# Patient Record
Sex: Male | Born: 1955 | Race: White | Hispanic: No | Marital: Married | Smoking: Never smoker
Health system: Southern US, Community
[De-identification: ages and names within clinical notes are randomized; demographics above are authoritative.]

## PROBLEM LIST (undated history)

## (undated) DIAGNOSIS — K429 Umbilical hernia without obstruction or gangrene: Secondary | ICD-10-CM

## (undated) DIAGNOSIS — E349 Endocrine disorder, unspecified: Secondary | ICD-10-CM

## (undated) HISTORY — PX: POLYPECTOMY: SHX149

## (undated) HISTORY — PX: OTHER SURGICAL HISTORY: SHX169

## (undated) HISTORY — PX: COLONOSCOPY: SHX174

## (undated) HISTORY — DX: Endocrine disorder, unspecified: E34.9

## (undated) HISTORY — DX: Umbilical hernia without obstruction or gangrene: K42.9

---

## 2003-09-06 ENCOUNTER — Encounter: Payer: Self-pay | Admitting: Family Medicine

## 2003-12-10 ENCOUNTER — Emergency Department (HOSPITAL_COMMUNITY): Admission: EM | Admit: 2003-12-10 | Discharge: 2003-12-11 | Payer: Self-pay | Admitting: Emergency Medicine

## 2009-05-17 ENCOUNTER — Ambulatory Visit: Payer: Self-pay | Admitting: Family Medicine

## 2009-05-17 DIAGNOSIS — N529 Male erectile dysfunction, unspecified: Secondary | ICD-10-CM | POA: Insufficient documentation

## 2009-05-20 LAB — CONVERTED CEMR LAB
ALT: 47 units/L (ref 0–53)
AST: 31 units/L (ref 0–37)
Albumin: 4.2 g/dL (ref 3.5–5.2)
Basophils Absolute: 0 10*3/uL (ref 0.0–0.1)
CO2: 25 meq/L (ref 19–32)
Chloride: 105 meq/L (ref 96–112)
GFR calc non Af Amer: 83.75 mL/min (ref >60)
Glucose, Bld: 94 mg/dL (ref 70–99)
HCT: 46.4 % (ref 39.0–52.0)
HDL: 39.6 mg/dL (ref >39.00)
Hemoglobin: 15.9 g/dL (ref 13.0–17.0)
Lymphs Abs: 1.9 10*3/uL (ref 0.7–4.0)
MCV: 89.8 fL (ref 78.0–100.0)
Monocytes Absolute: 0.5 10*3/uL (ref 0.1–1.0)
Monocytes Relative: 9 % (ref 3.0–12.0)
Neutro Abs: 3.5 10*3/uL (ref 1.4–7.7)
Potassium: 4.4 meq/L (ref 3.5–5.1)
RDW: 12 % (ref 11.5–14.6)
Sodium: 138 meq/L (ref 135–145)
TSH: 1.12 microintl units/mL (ref 0.35–5.50)

## 2009-05-21 ENCOUNTER — Ambulatory Visit: Payer: Self-pay | Admitting: Internal Medicine

## 2009-05-27 ENCOUNTER — Ambulatory Visit: Payer: Self-pay | Admitting: Family Medicine

## 2009-05-27 DIAGNOSIS — R7989 Other specified abnormal findings of blood chemistry: Secondary | ICD-10-CM | POA: Insufficient documentation

## 2009-05-31 ENCOUNTER — Encounter: Payer: Self-pay | Admitting: Internal Medicine

## 2009-05-31 ENCOUNTER — Ambulatory Visit: Payer: Self-pay | Admitting: Internal Medicine

## 2009-06-03 ENCOUNTER — Encounter: Payer: Self-pay | Admitting: Internal Medicine

## 2009-06-26 ENCOUNTER — Ambulatory Visit: Payer: Self-pay | Admitting: Family Medicine

## 2009-07-16 ENCOUNTER — Telehealth: Payer: Self-pay | Admitting: Family Medicine

## 2009-12-19 ENCOUNTER — Ambulatory Visit: Payer: Self-pay | Admitting: Family Medicine

## 2010-02-20 ENCOUNTER — Ambulatory Visit: Payer: Self-pay | Admitting: Family Medicine

## 2010-06-20 ENCOUNTER — Telehealth: Payer: Self-pay | Admitting: Family Medicine

## 2010-07-24 ENCOUNTER — Telehealth: Payer: Self-pay | Admitting: Family Medicine

## 2010-09-02 NOTE — Progress Notes (Signed)
Summary: Pt called req refill of Androgel to [facility] on [address]  Phone Note Refill Request Message from:  Patient on June 20, 2010 3:20 PM  Refills Requested: Medication #1:  ANDROGEL SPRAY 50MG  8 pumps daily.   Dosage confirmed as above?Dosage Confirmed  Method Requested: Telephone to Pharmacy Initial call taken by: Lucy Antigua,  June 20, 2010 3:19 PM    Prescriptions: ANDROGEL SPRAY 50MG  8 pumps daily  #1 x 5   Entered by:   Sid Falcon LPN   Authorized by:   Evelena Peat MD   Signed by:   Sid Falcon LPN on 16/05/9603   Method used:   Telephoned to ...       [facility]  [address]  [phone]* (retail)       [address]       [place], [place]  [address]       Ph: [phone] or [phone]       Fax: [phone]   RxID:   [id number]

## 2010-09-02 NOTE — Assessment & Plan Note (Signed)
Summary: MED CK (REFILL) // RS   Vital Signs:  Patient profile:   55 year old male Weight:      255 pounds Temp:     98.6 degrees F oral BP sitting:   130 / 80  (left arm) Cuff size:   large  Vitals Entered By: Sid Falcon LPN (Dec 19, 2009 1:30 PM) CC: 6 month ROV, med refills   History of Present Illness: Low testosterone levels.  Androgel 6 sprays/day and improved symptoms but still with some low energy and libido at times. Would like to increase levels. No chest pain.  Normal PSA last Fall.  Tolerating medication well with no side effects.  Allergies (verified): No Known Drug Allergies  Past History:  Past Medical History: Last updated: 05/27/2009 Testosterone deficiency  Review of Systems  The patient denies anorexia, weight loss, weight gain, vision loss, chest pain, syncope, dyspnea on exertion, peripheral edema, and headaches.    Physical Exam  General:  Well-developed,well-nourished,in no acute distress; alert,appropriate and cooperative throughout examination Neck:  No deformities, masses, or tenderness noted. Lungs:  Normal respiratory effort, chest expands symmetrically. Lungs are clear to auscultation, no crackles or wheezes. Heart:  Normal rate and regular rhythm. S1 and S2 normal without gallop, murmur, click, rub or other extra sounds.   Impression & Recommendations:  Problem # 1:  TESTICULAR HYPOFUNCTION (ICD-257.2) increase to 8 sprays/day and recheck levels in 2 months.  Complete Medication List: 1)  Androgel Spray 50mg   .... 8 pumps daily  Patient Instructions: 1)  Schedule total testosterone level in 2 months 257.2 Prescriptions: ANDROGEL SPRAY 50MG  8 pumps daily  #1 x 5   Entered and Authorized by:   Evelena Peat MD   Signed by:   Evelena Peat MD on 12/19/2009   Method used:   Print then Give to Patient   RxID:   [id number]

## 2010-09-04 NOTE — Progress Notes (Signed)
Summary: Androgel quantity update  Phone Note From Pharmacy   Caller: [facility]  [address]  [phone]* Call For: burchette  Summary of Call: Faxed request from pharmacy, Androgel pump 1%, apply 8 pumpa topically daily.  Quantity 150.  "This only lasts pt 15 days.  Can we fill with 2 boxes/month Initial call taken by: Sid Falcon LPN,  July 24, 2010 4:57 PM  Follow-up for Phone Call        yes. Follow-up by: Evelena Peat MD,  July 24, 2010 5:25 PM  Additional Follow-up for Phone Call Additional follow up Details #1::        done Additional Follow-up by: Pura Spice, RN,  July 25, 2010 1:21 PM    New/Updated Medications: * ANDROGEL SPRAY 50MG  8 pumps daily Prescriptions: ANDROGEL SPRAY 50MG  8 pumps daily  #2 boxes x 0   Entered by:   Pura Spice, RN   Authorized by:   Evelena Peat MD   Signed by:   Pura Spice, RN on 07/25/2010   Method used:   Telephoned to ...       [facility]  [address]  [phone]* (retail)       [address]       [place], [place]  [address]       Ph: [phone] or [phone]       Fax: [phone]   RxID:   [id number]

## 2011-02-09 ENCOUNTER — Other Ambulatory Visit: Payer: Self-pay | Admitting: Family Medicine

## 2011-02-09 NOTE — Telephone Encounter (Signed)
Pt called req refill of Adrogel. Pt heard that there is a concentrated version of this med now and pt is wondering if he could get this instread or does pt have to sch ov? [facility] [address].

## 2011-02-09 NOTE — Telephone Encounter (Signed)
Last OV 5/11, inrformed pt he needs return OV

## 2011-02-10 ENCOUNTER — Encounter: Payer: Self-pay | Admitting: Family Medicine

## 2011-02-13 ENCOUNTER — Encounter: Payer: Self-pay | Admitting: Family Medicine

## 2011-02-13 ENCOUNTER — Ambulatory Visit (INDEPENDENT_AMBULATORY_CARE_PROVIDER_SITE_OTHER): Payer: 59 | Admitting: Family Medicine

## 2011-02-13 VITALS — BP 124/90 | Temp 98.7°F | Wt 243.0 lb

## 2011-02-13 DIAGNOSIS — E291 Testicular hypofunction: Secondary | ICD-10-CM

## 2011-02-13 DIAGNOSIS — Z125 Encounter for screening for malignant neoplasm of prostate: Secondary | ICD-10-CM

## 2011-02-13 LAB — PSA: PSA: 0.72 ng/mL (ref 0.10–4.00)

## 2011-02-13 MED ORDER — TESTOSTERONE 50 MG/5GM (1%) TD GEL: 5.0000 g | Freq: Every day | TRANSDERMAL | Status: DC

## 2011-02-13 NOTE — Progress Notes (Signed)
  Subjective:    Patient ID: Guy Mcgee, male    DOB: 03-29-1956, 55 y.o.   MRN: [id number]  HPI Patient seen for medical followup. Hypogonadism. On testosterone replacement. Labs last year revealed levels back up to normal range. Energy level is greatly improved. No history of problems with libido. No adverse side effects. No BPH symptoms. PSA last checked over a year ago and normal   Review of Systems  Constitutional: Negative for activity change, appetite change and unexpected weight change.  Respiratory: Negative for shortness of breath.   Cardiovascular: Negative for chest pain, palpitations and leg swelling.  Gastrointestinal: Negative for abdominal pain.       Objective:   Physical Exam  Constitutional: He appears well-developed and well-nourished.  HENT:  Head: Normocephalic and atraumatic.  Right Ear: External ear normal.  Left Ear: External ear normal.  Mouth/Throat: Oropharynx is clear and moist.  Neck: Neck supple. No thyromegaly present.  Cardiovascular: Normal rate, regular rhythm and normal heart sounds.  Exam reveals no gallop.   No murmur heard. Pulmonary/Chest: Effort normal and breath sounds normal. No respiratory distress. He has no wheezes. He has no rales.  Lymphadenopathy:    He has no cervical adenopathy.          Assessment & Plan:  Hypogonadism. Check PSA. Refill medication. No symptoms of BPH. Consider complete physical later this year

## 2011-02-13 NOTE — Patient Instructions (Signed)
Consider physical at some point this year.

## 2011-02-16 NOTE — Progress Notes (Signed)
Quick Note:  Pt informed ______ 

## 2011-05-21 ENCOUNTER — Other Ambulatory Visit: Payer: Self-pay | Admitting: Family Medicine

## 2011-05-21 DIAGNOSIS — E291 Testicular hypofunction: Secondary | ICD-10-CM

## 2011-05-21 MED ORDER — TESTOSTERONE 50 MG/5GM (1%) TD GEL: 5.0000 g | Freq: Every day | TRANSDERMAL | Status: DC

## 2011-05-21 NOTE — Telephone Encounter (Signed)
Pt said that his testosterone (ANDROGEL) 50 MG/5GM GEL is showing 0 refills remaining. Pt is req a script to be called in [facility] on [address]

## 2011-07-10 ENCOUNTER — Ambulatory Visit: Payer: 59 | Admitting: Family Medicine

## 2011-11-13 ENCOUNTER — Telehealth: Payer: Self-pay | Admitting: Family Medicine

## 2011-11-13 DIAGNOSIS — E291 Testicular hypofunction: Secondary | ICD-10-CM

## 2011-11-13 MED ORDER — TESTOSTERONE 50 MG/5GM (1%) TD GEL: TRANSDERMAL | Status: DC

## 2011-11-13 NOTE — Telephone Encounter (Signed)
Last OV 02-13-11, last filled # 1 with 6 refills

## 2011-11-13 NOTE — Telephone Encounter (Signed)
Rx called in, pt informed he needs return OV in the next month or so.  He will schedule

## 2011-11-13 NOTE — Telephone Encounter (Signed)
Refill OK but go ahead and schedule office follow up within 2 months.

## 2011-11-13 NOTE — Telephone Encounter (Signed)
Pt's Androgel is about to run out. Can he get a refill, or does he need an OV first? Please call. Pt uses [facility] on [address].

## 2011-12-08 ENCOUNTER — Encounter: Payer: Self-pay | Admitting: Family Medicine

## 2011-12-08 ENCOUNTER — Ambulatory Visit (INDEPENDENT_AMBULATORY_CARE_PROVIDER_SITE_OTHER): Payer: 59 | Admitting: Family Medicine

## 2011-12-08 ENCOUNTER — Telehealth: Payer: Self-pay | Admitting: *Deleted

## 2011-12-08 VITALS — BP 122/88 | Temp 98.3°F | Wt 234.0 lb

## 2011-12-08 DIAGNOSIS — Z0000 Encounter for general adult medical examination without abnormal findings: Secondary | ICD-10-CM

## 2011-12-08 DIAGNOSIS — D582 Other hemoglobinopathies: Secondary | ICD-10-CM

## 2011-12-08 DIAGNOSIS — E291 Testicular hypofunction: Secondary | ICD-10-CM

## 2011-12-08 LAB — LIPID PANEL
Cholesterol: 206 mg/dL — ABNORMAL HIGH (ref 0–200)
Total CHOL/HDL Ratio: 4
Triglycerides: 77 mg/dL (ref 0.0–149.0)
VLDL: 15.4 mg/dL (ref 0.0–40.0)

## 2011-12-08 LAB — HEPATIC FUNCTION PANEL
Albumin: 4.3 g/dL (ref 3.5–5.2)
Alkaline Phosphatase: 49 U/L (ref 39–117)
Bilirubin, Direct: 0.1 mg/dL (ref 0.0–0.3)

## 2011-12-08 LAB — CBC WITH DIFFERENTIAL/PLATELET
Basophils Absolute: 0 10*3/uL (ref 0.0–0.1)
Lymphocytes Relative: 22.8 % (ref 12.0–46.0)
Lymphs Abs: 1.7 10*3/uL (ref 0.7–4.0)
Monocytes Relative: 7.4 % (ref 3.0–12.0)
Platelets: 227 10*3/uL (ref 150.0–400.0)
RDW: 14 % (ref 11.5–14.6)

## 2011-12-08 LAB — LDL CHOLESTEROL, DIRECT: Direct LDL: 147.5 mg/dL

## 2011-12-08 LAB — BASIC METABOLIC PANEL
Chloride: 104 mEq/L (ref 96–112)
Potassium: 5.1 mEq/L (ref 3.5–5.1)

## 2011-12-08 LAB — TSH: TSH: 0.96 u[IU]/mL (ref 0.35–5.50)

## 2011-12-08 NOTE — Telephone Encounter (Signed)
Please send this to Dr. Caryl Never

## 2011-12-08 NOTE — Progress Notes (Signed)
  Subjective:    Patient ID: Guy Mcgee, male    DOB: March 14, 1956, 56 y.o.   MRN: [id number]  HPI  Patient seen for complete physical. History of hypogonadism treated with testosterone replacement. He feels much better since starting this couple years ago. He is exercising regularly. Has lost over 6 inches off his waist and weight overall has improved. Increased stamina. Sleeping well. No adverse side effects. No chest pains. Needs repeat lab work.  Tetanus up-to-date. Colonoscopy up to date. No history of smoking.  Past Medical History  Diagnosis Date  . Testosterone deficiency    Past Surgical History  Procedure Date  . Inguinal herniorrhapy     reports that he has never smoked. He does not have any smokeless tobacco history on file. His alcohol and drug histories not on file. family history includes Cancer in his other; Diabetes in his other; and Hypertension in his other. No Known Allergies    Review of Systems  Constitutional: Negative for fever, activity change, appetite change and fatigue.  HENT: Negative for ear pain, congestion and trouble swallowing.   Eyes: Negative for pain and visual disturbance.  Respiratory: Negative for cough, shortness of breath and wheezing.   Cardiovascular: Negative for chest pain and palpitations.  Gastrointestinal: Negative for nausea, vomiting, abdominal pain, diarrhea, constipation, blood in stool, abdominal distention and rectal pain.  Genitourinary: Negative for dysuria, hematuria and testicular pain.  Musculoskeletal: Negative for joint swelling and arthralgias.  Skin: Negative for rash.  Neurological: Negative for dizziness, syncope and headaches.  Hematological: Negative for adenopathy.  Psychiatric/Behavioral: Negative for confusion and dysphoric mood.       Objective:   Physical Exam  Constitutional: He is oriented to person, place, and time. He appears well-developed and well-nourished. No distress.  HENT:  Head:  Normocephalic and atraumatic.  Right Ear: External ear normal.  Left Ear: External ear normal.  Mouth/Throat: Oropharynx is clear and moist.  Eyes: Conjunctivae and EOM are normal. Pupils are equal, round, and reactive to light.  Neck: Normal range of motion. Neck supple. No thyromegaly present.  Cardiovascular: Normal rate, regular rhythm and normal heart sounds.   No murmur heard. Pulmonary/Chest: No respiratory distress. He has no wheezes. He has no rales.  Abdominal: Soft. Bowel sounds are normal. He exhibits no distension and no mass. There is no tenderness. There is no rebound and no guarding.  Musculoskeletal: He exhibits no edema.  Lymphadenopathy:    He has no cervical adenopathy.  Neurological: He is alert and oriented to person, place, and time. He displays normal reflexes. No cranial nerve deficit.  Skin: No rash noted.  Psychiatric: He has a normal mood and affect.          Assessment & Plan:  Health maintenance exam. Obtain screening lab work. Include PSA and also check repeat testosterone levels. Tetanus and colonoscopy up to date. Continue regular exercise.

## 2011-12-08 NOTE — Telephone Encounter (Signed)
Critical lab phone call from [facility], pt hgb 18.2.  Dr Caryl Never pt, he is not here currently, will inform Dr Tawanna Cooler

## 2011-12-08 NOTE — Telephone Encounter (Signed)
Reduce testosterone to 3 spray pumps per arm daily (currently at 4) and repeat CBC 2 months

## 2011-12-09 NOTE — Telephone Encounter (Signed)
Addended by: Melchor Amour on: 12/09/2011 09:21 AM   Modules accepted: Orders

## 2011-12-09 NOTE — Telephone Encounter (Signed)
Pt informed, CBC, future lab ordered

## 2011-12-10 NOTE — Progress Notes (Signed)
Quick Note:  Pt informed, future labs ordered ______ 

## 2012-02-11 ENCOUNTER — Other Ambulatory Visit: Payer: 59

## 2012-02-15 ENCOUNTER — Other Ambulatory Visit (INDEPENDENT_AMBULATORY_CARE_PROVIDER_SITE_OTHER): Payer: 59

## 2012-02-15 DIAGNOSIS — D582 Other hemoglobinopathies: Secondary | ICD-10-CM

## 2012-02-15 LAB — CBC
HCT: 51.7 % (ref 39.0–52.0)
MCHC: 33.5 g/dL (ref 30.0–36.0)
MCV: 91.1 fl (ref 78.0–100.0)
Platelets: 200 10*3/uL (ref 150.0–400.0)

## 2012-02-25 ENCOUNTER — Other Ambulatory Visit: Payer: Self-pay | Admitting: Family Medicine

## 2012-02-25 NOTE — Telephone Encounter (Signed)
Pt had CBC repeat lab after 1 month, OK.  Androgel last filled 11-13-11, 2 packages with 1 refill.

## 2012-02-28 NOTE — Telephone Encounter (Signed)
OK to refill for 6 months.  Don't know if his insurance would allow, but we should give him option of more concentrated Androgel 1.62% which would be only total of 4 pump sprays daily (2 per side).

## 2012-02-29 NOTE — Telephone Encounter (Signed)
Last seen 12/2011 - and had labs done at that time too - please advise

## 2012-03-01 NOTE — Telephone Encounter (Signed)
Refill for 6 months. 

## 2012-03-01 NOTE — Telephone Encounter (Signed)
Last OV may, 2013, no future appts scheduled Androgel last filled 11-13-11, two packages with 1 refill

## 2012-03-02 ENCOUNTER — Other Ambulatory Visit: Payer: Self-pay | Admitting: *Deleted

## 2012-03-02 DIAGNOSIS — E291 Testicular hypofunction: Secondary | ICD-10-CM

## 2012-03-02 MED ORDER — TESTOSTERONE 50 MG/5GM (1%) TD GEL: TRANSDERMAL | Status: DC

## 2012-09-08 ENCOUNTER — Other Ambulatory Visit: Payer: Self-pay | Admitting: Family Medicine

## 2012-10-10 ENCOUNTER — Ambulatory Visit (INDEPENDENT_AMBULATORY_CARE_PROVIDER_SITE_OTHER)
Admission: RE | Admit: 2012-10-10 | Discharge: 2012-10-10 | Disposition: A | Discharge status: Home / Self Care | Payer: 59 | Source: Ambulatory Visit | Attending: Family Medicine | Admitting: Family Medicine

## 2012-10-10 ENCOUNTER — Ambulatory Visit (INDEPENDENT_AMBULATORY_CARE_PROVIDER_SITE_OTHER): Payer: 59 | Admitting: Family Medicine

## 2012-10-10 ENCOUNTER — Encounter: Payer: Self-pay | Admitting: Family Medicine

## 2012-10-10 VITALS — BP 128/88 | Temp 99.2°F | Wt 256.0 lb

## 2012-10-10 DIAGNOSIS — R1011 Right upper quadrant pain: Secondary | ICD-10-CM

## 2012-10-10 LAB — CBC WITH DIFFERENTIAL/PLATELET
Basophils Absolute: 0 10*3/uL (ref 0.0–0.1)
Eosinophils Absolute: 0.3 10*3/uL (ref 0.0–0.7)
Eosinophils Relative: 2.8 % (ref 0.0–5.0)
HCT: 47.3 % (ref 39.0–52.0)
Lymphs Abs: 1.6 10*3/uL (ref 0.7–4.0)
MCHC: 33.7 g/dL (ref 30.0–36.0)
MCV: 89.2 fl (ref 78.0–100.0)
Monocytes Absolute: 1 10*3/uL (ref 0.1–1.0)
Platelets: 249 10*3/uL (ref 150.0–400.0)
RDW: 13 % (ref 11.5–14.6)

## 2012-10-10 NOTE — Patient Instructions (Addendum)
No further NSAIDS (eg Ibuprofen) Take daily Prilosec or Prevacid for now until this is resolving.

## 2012-10-10 NOTE — Progress Notes (Signed)
  Subjective:    Patient ID: Guy Mcgee, male    DOB: September 12, 1955, 57 y.o.   MRN: [id number]  HPI Patient seen for acute visit.  Right upper quadrant pain. Onset last Friday. He describes soreness to touch and worse with movement. No known injury. Quality is sharp to achy. 7-8/10 intensity at times Constant. No clear triggers other than movement. Not related to eating. Took ibuprofen without relief. Denies associated nausea or vomiting. No cough. Mild decreased appetite. No rash.  Patient relates possible low-grade fever past couple days though no temperature over 100. Temperature in 99 range. Last night did describe chills and sweats. -again, no cough   Review of Systems  Constitutional: Positive for fever and chills.  Respiratory: Negative for cough, chest tightness, shortness of breath and wheezing.   Cardiovascular: Negative for chest pain, palpitations and leg swelling.  Gastrointestinal: Positive for abdominal pain. Negative for nausea, vomiting, diarrhea, blood in stool and abdominal distention.  Skin: Negative for rash.       Objective:   Physical Exam  Constitutional: He appears well-developed and well-nourished. No distress.  Neck: Neck supple.  Cardiovascular: Regular rhythm.   Pulmonary/Chest: Effort normal and breath sounds normal. No respiratory distress. He has no wheezes. He has no rales.  Abdominal: Soft. Bowel sounds are normal. He exhibits no distension and no mass. There is no rebound and no guarding.  Mild tenderness right upper quadrant. He has some tenderness over the right lower rib cage area. No hepatomegaly. No splenomegaly  Genitourinary: Rectum normal.  No rectal mass and brown heme negative stool.  Skin: No rash noted.          Assessment & Plan:  Abdominal pain right upper quadrant. Constant nature,pain to touch and augmentation with movement suggests possible musculoskeletal origin. He has not had any cough or other findings to suggest  likely pneumonia. Doubt symptomatic gallstones. Doubt peptic ulcer disease. No rash (yet) to suggest shingles.  Start with labs -CBC, hepatic panel, basic metabolic panel. Chest x-ray. Consider abdominal ultrasound

## 2012-10-11 LAB — BASIC METABOLIC PANEL
BUN: 13 mg/dL (ref 6–23)
Chloride: 101 mEq/L (ref 96–112)
GFR: 77.28 mL/min (ref >60.00)
Potassium: 4.6 mEq/L (ref 3.5–5.1)
Sodium: 136 mEq/L (ref 135–145)

## 2012-10-11 LAB — HEPATIC FUNCTION PANEL
Bilirubin, Direct: 0.2 mg/dL (ref 0.0–0.3)
Total Protein: 7.2 g/dL (ref 6.0–8.3)

## 2012-10-11 NOTE — Progress Notes (Signed)
Quick Note:  LMTCB ______ 

## 2012-10-14 NOTE — Progress Notes (Signed)
Quick Note:  I called pt again, and he is reporting he is gradually improving. FYI ______

## 2012-10-24 ENCOUNTER — Other Ambulatory Visit: Payer: Self-pay | Admitting: Family Medicine

## 2012-10-26 NOTE — Telephone Encounter (Signed)
Refill for 6 months. 

## 2012-10-26 NOTE — Telephone Encounter (Signed)
Androgel 4 pumps daily last filled 5 packages with 6 refills on 12-08-11 Last testosterone lab test 12-08-11

## 2012-10-27 ENCOUNTER — Other Ambulatory Visit: Payer: Self-pay | Admitting: Family Medicine

## 2012-11-04 ENCOUNTER — Telehealth: Payer: Self-pay | Admitting: Family Medicine

## 2012-11-04 NOTE — Telephone Encounter (Signed)
Fax never received. Call Nicolette Bang to verify fax # - it was correct. Prior auth initiated and submitted to [employer].

## 2012-11-04 NOTE — Telephone Encounter (Signed)
Pt stating pharm said he need prior approval for ANDROGEL PUMP 12.5 MG/ACT (1%) GEL. Pt states he has been on this med for 3 years. Pharm: Art therapist has sent fax three times. Pls advise.

## 2012-11-07 ENCOUNTER — Other Ambulatory Visit: Payer: Self-pay | Admitting: Family Medicine

## 2012-11-07 DIAGNOSIS — Z8639 Personal history of other endocrine, nutritional and metabolic disease: Secondary | ICD-10-CM

## 2012-11-07 NOTE — Telephone Encounter (Signed)
Patietn's prior auth for Androgel denied. Insurance covers TESTIM (will still need PA though). Please advise. Thank you.

## 2012-11-08 NOTE — Telephone Encounter (Signed)
Prior auth initiated.

## 2012-11-08 NOTE — Telephone Encounter (Signed)
Pt informed Testim gel called into his Psychologist, forensic.

## 2012-11-08 NOTE — Telephone Encounter (Signed)
Androgel denied. Testim preferred. New PA initiated. Encounter closed.

## 2012-11-08 NOTE — Telephone Encounter (Signed)
OK to d/c Androgel and start Testim gel 5 gm (one tube) once daily and will need repeat testosterone level in 2-3 weeks after starting to assess levels.

## 2013-01-13 ENCOUNTER — Other Ambulatory Visit (INDEPENDENT_AMBULATORY_CARE_PROVIDER_SITE_OTHER): Payer: 59

## 2013-01-13 DIAGNOSIS — Z8639 Personal history of other endocrine, nutritional and metabolic disease: Secondary | ICD-10-CM

## 2013-01-13 DIAGNOSIS — Z862 Personal history of diseases of the blood and blood-forming organs and certain disorders involving the immune mechanism: Secondary | ICD-10-CM

## 2013-01-13 LAB — TESTOSTERONE: Testosterone: 281.22 ng/dL — ABNORMAL LOW (ref 350.00–890.00)

## 2013-01-16 ENCOUNTER — Telehealth: Payer: Self-pay | Admitting: Family Medicine

## 2013-01-16 NOTE — Telephone Encounter (Signed)
Pt returned your call. Pls call

## 2013-01-16 NOTE — Progress Notes (Signed)
Quick Note:  Pt informed and yes he is using the Testem, one small tube every day. He received a letter from his insurance co and Dr Caryl Never needs to fill something out for them. He is going to schedule OV to discuss. He would like to go back to the Androgel as it seemed to work much better. ______

## 2013-01-16 NOTE — Telephone Encounter (Signed)
Pt decided to come for OV and discuss meds and labs

## 2013-01-18 ENCOUNTER — Ambulatory Visit: Payer: 59 | Admitting: Family Medicine

## 2013-01-24 ENCOUNTER — Encounter: Payer: Self-pay | Admitting: Family Medicine

## 2013-01-24 ENCOUNTER — Ambulatory Visit (INDEPENDENT_AMBULATORY_CARE_PROVIDER_SITE_OTHER): Payer: 59 | Admitting: Family Medicine

## 2013-01-24 VITALS — BP 132/88 | HR 98 | Temp 98.6°F | Resp 16 | Wt 240.0 lb

## 2013-01-24 DIAGNOSIS — E291 Testicular hypofunction: Secondary | ICD-10-CM

## 2013-01-24 MED ORDER — TESTOSTERONE 12.5 MG/ACT (1%) TD GEL: 8.0000 | Freq: Every day | TRANSDERMAL | Status: DC

## 2013-01-24 NOTE — Progress Notes (Signed)
  Subjective:    Patient ID: Guy Mcgee, male    DOB: 12/21/1955, 57 y.o.   MRN: [id number]  HPI Patient seen for followup regarding low testosterone He's done very well with AndroGel pump but because of insurance issues we had to try different topical He is currently using Testim 1% and recent testosterone levels dropped from 437 to 281. He also feels increased malaise and fatigue since making this change. He is concerned about not absorbing the newer form of testosterone as well. He requests going back on AndroGel at this time.  He's been getting less exercise because of his fatigue since making change above. He's never had any adverse side effects from testosterone replacement. Last PSA was one year ago and normal  Past Medical History  Diagnosis Date  . Testosterone deficiency    Past Surgical History  Procedure Laterality Date  . Inguinal herniorrhapy      reports that he has never smoked. He does not have any smokeless tobacco history on file. His alcohol and drug histories are not on file. family history includes Cancer in his other; Diabetes in his other; and Hypertension in his other. No Known Allergies    Review of Systems  Constitutional: Positive for fatigue.  Respiratory: Negative for shortness of breath.   Cardiovascular: Negative for chest pain, palpitations and leg swelling.  Genitourinary: Negative for dysuria.  Neurological: Negative for headaches.       Objective:   Physical Exam  Constitutional: He appears well-developed and well-nourished.  Neck: Neck supple. No thyromegaly present.  Cardiovascular: Normal rate and regular rhythm.   Pulmonary/Chest: Effort normal and breath sounds normal. No respiratory distress. He has no wheezes. He has no rales.          Assessment & Plan:  Low testosterone. Get prior authorization for AndroGel pump. He has recently made changes to Testim 1% and has had documented drop in testosterone levels as well as  increase in symptoms since making this change. We recommend schedule complete physical and include PSA at that time

## 2013-03-08 ENCOUNTER — Ambulatory Visit (INDEPENDENT_AMBULATORY_CARE_PROVIDER_SITE_OTHER): Payer: 59 | Admitting: Family Medicine

## 2013-03-08 ENCOUNTER — Encounter: Payer: Self-pay | Admitting: Family Medicine

## 2013-03-08 VITALS — BP 142/90 | HR 115 | Temp 98.1°F | Wt 235.0 lb

## 2013-03-08 DIAGNOSIS — N509 Disorder of male genital organs, unspecified: Secondary | ICD-10-CM

## 2013-03-08 DIAGNOSIS — N5082 Scrotal pain: Secondary | ICD-10-CM

## 2013-03-08 MED ORDER — DOXYCYCLINE HYCLATE 100 MG PO CAPS: 100.0000 mg | ORAL_CAPSULE | Freq: Two times a day (BID) | ORAL | Status: DC
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
## 2013-03-08 NOTE — Patient Instructions (Addendum)
Use good support underwear Consider topical heat- ie tub baths Consider Advil or Aleve. Touch base in one week if no better We should consider ultrasound in one week if inflammation not improved.

## 2013-06-13 ENCOUNTER — Inpatient Hospital Stay (HOSPITAL_COMMUNITY)
Admission: EM | Admit: 2013-06-13 | Discharge: 2013-06-15 | DRG: 074 | Disposition: A | Discharge status: Home / Self Care | Payer: 59 | Attending: Family Medicine | Admitting: Family Medicine

## 2013-06-13 ENCOUNTER — Emergency Department (HOSPITAL_COMMUNITY): Payer: 59

## 2013-06-13 ENCOUNTER — Encounter (HOSPITAL_COMMUNITY): Payer: Self-pay | Admitting: Emergency Medicine

## 2013-06-13 DIAGNOSIS — E291 Testicular hypofunction: Secondary | ICD-10-CM

## 2013-06-13 DIAGNOSIS — F329 Major depressive disorder, single episode, unspecified: Secondary | ICD-10-CM | POA: Diagnosis present

## 2013-06-13 DIAGNOSIS — G5630 Lesion of radial nerve, unspecified upper limb: Principal | ICD-10-CM | POA: Diagnosis present

## 2013-06-13 DIAGNOSIS — G8191 Hemiplegia, unspecified affecting right dominant side: Secondary | ICD-10-CM

## 2013-06-13 DIAGNOSIS — F1010 Alcohol abuse, uncomplicated: Secondary | ICD-10-CM | POA: Diagnosis present

## 2013-06-13 DIAGNOSIS — G5631 Lesion of radial nerve, right upper limb: Secondary | ICD-10-CM

## 2013-06-13 DIAGNOSIS — F411 Generalized anxiety disorder: Secondary | ICD-10-CM | POA: Diagnosis present

## 2013-06-13 DIAGNOSIS — R7989 Other specified abnormal findings of blood chemistry: Secondary | ICD-10-CM

## 2013-06-13 DIAGNOSIS — E869 Volume depletion, unspecified: Secondary | ICD-10-CM | POA: Diagnosis present

## 2013-06-13 DIAGNOSIS — R2990 Unspecified symptoms and signs involving the nervous system: Secondary | ICD-10-CM

## 2013-06-13 DIAGNOSIS — N529 Male erectile dysfunction, unspecified: Secondary | ICD-10-CM

## 2013-06-13 DIAGNOSIS — F3289 Other specified depressive episodes: Secondary | ICD-10-CM | POA: Diagnosis present

## 2013-06-13 DIAGNOSIS — Z23 Encounter for immunization: Secondary | ICD-10-CM

## 2013-06-13 DIAGNOSIS — G8190 Hemiplegia, unspecified affecting unspecified side: Secondary | ICD-10-CM | POA: Diagnosis present

## 2013-06-13 DIAGNOSIS — F32A Depression, unspecified: Secondary | ICD-10-CM

## 2013-06-13 LAB — CBC
MCHC: 34.3 g/dL (ref 30.0–36.0)
Platelets: 227 10*3/uL (ref 150–400)
RDW: 13.2 % (ref 11.5–15.5)
WBC: 16.6 10*3/uL — ABNORMAL HIGH (ref 4.0–10.5)

## 2013-06-13 LAB — PROTIME-INR
INR: 0.97 (ref 0.00–1.49)
Prothrombin Time: 12.7 seconds (ref 11.6–15.2)

## 2013-06-13 LAB — COMPREHENSIVE METABOLIC PANEL
ALT: 203 U/L — ABNORMAL HIGH (ref 0–53)
AST: 340 U/L — ABNORMAL HIGH (ref 0–37)
Albumin: 4 g/dL (ref 3.5–5.2)
CO2: 21 mEq/L (ref 19–32)
Calcium: 8.8 mg/dL (ref 8.4–10.5)
Chloride: 99 mEq/L (ref 96–112)
Creatinine, Ser: 2.04 mg/dL — ABNORMAL HIGH (ref 0.50–1.35)
GFR calc non Af Amer: 35 mL/min — ABNORMAL LOW (ref >90)
Potassium: 4 mEq/L (ref 3.5–5.1)
Sodium: 133 mEq/L — ABNORMAL LOW (ref 135–145)
Total Bilirubin: 0.5 mg/dL (ref 0.3–1.2)

## 2013-06-13 LAB — DIFFERENTIAL
Basophils Absolute: 0 10*3/uL (ref 0.0–0.1)
Basophils Relative: 0 % (ref 0–1)
Lymphocytes Relative: 9 % — ABNORMAL LOW (ref 12–46)
Neutro Abs: 13.4 10*3/uL — ABNORMAL HIGH (ref 1.7–7.7)
Neutrophils Relative %: 81 % — ABNORMAL HIGH (ref 43–77)

## 2013-06-13 LAB — ETHANOL: Alcohol, Ethyl (B): <11 mg/dL (ref 0–11)

## 2013-06-13 LAB — APTT: aPTT: 22 seconds — ABNORMAL LOW (ref 24–37)

## 2013-06-13 LAB — GLUCOSE, CAPILLARY: Glucose-Capillary: 186 mg/dL — ABNORMAL HIGH (ref 70–99)

## 2013-06-13 NOTE — ED Notes (Signed)
Pt states he just doesn't feel "right"-pt states he drank some yesterday and states he took some sleeping pills last night because he has not been feeling well-pt states right sided weakness since 1700 this evening

## 2013-06-13 NOTE — ED Notes (Signed)
Patient transported to CT 

## 2013-06-13 NOTE — ED Provider Notes (Signed)
CSN: [id number]     Arrival date & time 06/13/13  2253 History   First MD Initiated Contact with Patient 06/13/13 2304     Chief Complaint  Patient presents with  . stroke type symptoms    (Consider location/radiation/quality/duration/timing/severity/associated sxs/prior Treatment) Patient is a 57 y.o. male presenting with Acute Neurological Problem. The history is provided by the patient. No language interpreter was used.  Cerebrovascular Accident This is a new problem. The current episode started 6 to 12 hours ago (5PM tonight). The problem occurs constantly. The problem has not changed since onset.Pertinent negatives include no chest pain, no abdominal pain, no headaches and no shortness of breath. Nothing aggravates the symptoms. Nothing relieves the symptoms. He has tried nothing for the symptoms. The treatment provided no relief.    Past Medical History  Diagnosis Date  . Testosterone deficiency    Past Surgical History  Procedure Laterality Date  . Inguinal herniorrhapy     Family History  Problem Relation Age of Onset  . Cancer Other     breast  . Diabetes Other   . Hypertension Other    History  Substance Use Topics  . Smoking status: Never Smoker   . Smokeless tobacco: Not on file  . Alcohol Use: Yes    Review of Systems  Constitutional: Negative for fever, activity change, appetite change and fatigue.  HENT: Negative for congestion, facial swelling, rhinorrhea and trouble swallowing.   Eyes: Negative for photophobia and pain.  Respiratory: Negative for cough, chest tightness and shortness of breath.   Cardiovascular: Negative for chest pain and leg swelling.  Gastrointestinal: Negative for nausea, vomiting, abdominal pain, diarrhea and constipation.  Endocrine: Negative for polydipsia and polyuria.  Genitourinary: Negative for dysuria, urgency, decreased urine volume and difficulty urinating.  Musculoskeletal: Negative for back pain and gait problem.  Skin:  Negative for color change, rash and wound.  Allergic/Immunologic: Negative for immunocompromised state.  Neurological: Negative for dizziness, facial asymmetry, speech difficulty, weakness, numbness and headaches.  Psychiatric/Behavioral: Negative for confusion, decreased concentration and agitation.    Allergies  Review of patient's allergies indicates no known allergies.  Home Medications   Current Outpatient Rx  Name  Route  Sig  Dispense  Refill  . Testosterone (ANDROGEL PUMP) 12.5 MG/ACT (1%) GEL   Topical   Apply 8 sprays topically daily. Apply 4 pumps to each upper arm daily (8 pumps per day)   300 g   5     May refill for 6 months    BP 142/91  Pulse 135  Temp(Src) 98.5 F (36.9 C) (Oral)  Resp 16  SpO2 100% Physical Exam  Constitutional: He is oriented to person, place, and time. He appears well-developed and well-nourished. No distress.  HENT:  Head: Normocephalic and atraumatic.  Mouth/Throat: No oropharyngeal exudate.  Eyes: Pupils are equal, round, and reactive to light.  Neck: Normal range of motion. Neck supple.  Cardiovascular: Normal rate, regular rhythm and normal heart sounds.  Exam reveals no gallop and no friction rub.   No murmur heard. Pulmonary/Chest: Effort normal and breath sounds normal. No respiratory distress. He has no wheezes. He has no rales.  Abdominal: Soft. Bowel sounds are normal. He exhibits no distension and no mass. There is no tenderness. There is no rebound and no guarding.  Musculoskeletal: Normal range of motion. He exhibits no edema and no tenderness.  Neurological: He is alert and oriented to person, place, and time. He displays no tremor. A sensory deficit is  present. No cranial nerve deficit. He exhibits abnormal muscle tone. GCS eye subscore is 4. GCS verbal subscore is 5. GCS motor subscore is 6.  RUE 4/5, RLE 4/5,  Skin: Skin is warm and dry.  Psychiatric: He has a normal mood and affect.    ED Course  Procedures  (including critical care time) Labs Review Labs Reviewed  APTT - Abnormal; Notable for the following:    aPTT 22 (*)    All other components within normal limits  CBC - Abnormal; Notable for the following:    WBC 16.6 (*)    Hemoglobin 17.3 (*)    All other components within normal limits  DIFFERENTIAL - Abnormal; Notable for the following:    Neutrophils Relative % 81 (*)    Neutro Abs 13.4 (*)    Lymphocytes Relative 9 (*)    Monocytes Absolute 1.6 (*)    All other components within normal limits  COMPREHENSIVE METABOLIC PANEL - Abnormal; Notable for the following:    Sodium 133 (*)    Glucose, Bld 125 (*)    BUN 33 (*)    Creatinine, Ser 2.04 (*)    AST 340 (*)    ALT 203 (*)    GFR calc non Af Amer 35 (*)    GFR calc Af Amer 41 (*)    All other components within normal limits  GLUCOSE, CAPILLARY - Abnormal; Notable for the following:    Glucose-Capillary 186 (*)    All other components within normal limits  ETHANOL  PROTIME-INR  TROPONIN I  URINE RAPID DRUG SCREEN (HOSP PERFORMED)  URINALYSIS, ROUTINE W REFLEX MICROSCOPIC   Imaging Review Ct Head Wo Contrast  06/13/2013   CLINICAL DATA:  Right-sided weakness.  EXAM: CT HEAD WITHOUT CONTRAST  TECHNIQUE: Contiguous axial images were obtained from the base of the skull through the vertex without intravenous contrast.  COMPARISON:  None.  FINDINGS: Normal appearing cerebral hemispheres and posterior fossa structures. Normal size and position of the ventricles. No intracranial hemorrhage, mass lesion or CT evidence of acute infarction. Unremarkable bones and included paranasal sinuses.  IMPRESSION: Normal examination.   Electronically Signed   By: Gordan Payment M.D.   On: 06/13/2013 23:35    EKG Interpretation   None       MDM   1. Stroke-like symptoms    Pt is a 57 y.o. male with Pmhx as above who presents with R hand, R leg weakness today, with rough onset around 5pm today, but pt says he has been sleeping a lot  today due to drinking and taking sleeping pills last night. Denies recent illness, injury, falls.  On PE, Pt tachycardic, awake, alert, appropriate.  4/5 strength RUE, RLE, though is inconsistent with exam of hand.  Have spoken to Neurology, will get CT here, but will not initiate code CVA due to timing. Neurology will see as inpt.    12:41 AM CT unremarkable. WBC 16.6.  AST, ALT elevated.  Trop negative. EKG w/ sinus tachycardia. [facility] consulted for admission. Plan to transfer to [facility] for full CVA w/u.        Shanna Cisco, MD 06/14/13 [phone]

## 2013-06-13 NOTE — ED Notes (Signed)
Pt states he got a friend to bring him to the ED tonight

## 2013-06-14 ENCOUNTER — Inpatient Hospital Stay (HOSPITAL_COMMUNITY): Payer: 59

## 2013-06-14 ENCOUNTER — Encounter (HOSPITAL_COMMUNITY): Payer: Self-pay | Admitting: Internal Medicine

## 2013-06-14 DIAGNOSIS — F341 Dysthymic disorder: Secondary | ICD-10-CM

## 2013-06-14 DIAGNOSIS — R7989 Other specified abnormal findings of blood chemistry: Secondary | ICD-10-CM

## 2013-06-14 DIAGNOSIS — F329 Major depressive disorder, single episode, unspecified: Secondary | ICD-10-CM | POA: Diagnosis present

## 2013-06-14 DIAGNOSIS — F32A Depression, unspecified: Secondary | ICD-10-CM | POA: Diagnosis present

## 2013-06-14 DIAGNOSIS — R29818 Other symptoms and signs involving the nervous system: Secondary | ICD-10-CM

## 2013-06-14 DIAGNOSIS — F1010 Alcohol abuse, uncomplicated: Secondary | ICD-10-CM

## 2013-06-14 DIAGNOSIS — G8190 Hemiplegia, unspecified affecting unspecified side: Secondary | ICD-10-CM

## 2013-06-14 DIAGNOSIS — G5630 Lesion of radial nerve, unspecified upper limb: Secondary | ICD-10-CM | POA: Diagnosis present

## 2013-06-14 DIAGNOSIS — G459 Transient cerebral ischemic attack, unspecified: Secondary | ICD-10-CM

## 2013-06-14 DIAGNOSIS — I517 Cardiomegaly: Secondary | ICD-10-CM

## 2013-06-14 DIAGNOSIS — R000 Tachycardia, unspecified: Secondary | ICD-10-CM

## 2013-06-14 LAB — URINE MICROSCOPIC-ADD ON

## 2013-06-14 LAB — URINALYSIS, ROUTINE W REFLEX MICROSCOPIC
Bilirubin Urine: NEGATIVE
Glucose, UA: NEGATIVE mg/dL
Ketones, ur: 15 mg/dL — AB
Nitrite: NEGATIVE
Protein, ur: 30 mg/dL — AB
Specific Gravity, Urine: 1.027 (ref 1.005–1.030)
Urobilinogen, UA: 0.2 mg/dL (ref 0.0–1.0)

## 2013-06-14 LAB — GLUCOSE, CAPILLARY: Glucose-Capillary: 95 mg/dL (ref 70–99)

## 2013-06-14 LAB — RAPID URINE DRUG SCREEN, HOSP PERFORMED
Amphetamines: NOT DETECTED
Barbiturates: NOT DETECTED
Benzodiazepines: NOT DETECTED
Cocaine: NOT DETECTED
Opiates: POSITIVE — AB
Tetrahydrocannabinol: NOT DETECTED

## 2013-06-14 LAB — LIPID PANEL
LDL Cholesterol: 65 mg/dL (ref 0–99)
VLDL: 41 mg/dL — ABNORMAL HIGH (ref 0–40)

## 2013-06-14 LAB — SODIUM, URINE, RANDOM: Sodium, Ur: 35 mEq/L

## 2013-06-14 LAB — CREATININE, SERUM
Creatinine, Ser: 2.29 mg/dL — ABNORMAL HIGH (ref 0.50–1.35)
GFR calc Af Amer: 35 mL/min — ABNORMAL LOW (ref >90)
GFR calc non Af Amer: 30 mL/min — ABNORMAL LOW (ref >90)

## 2013-06-14 LAB — CBC
Hemoglobin: 15.7 g/dL (ref 13.0–17.0)
RBC: 5.07 MIL/uL (ref 4.22–5.81)
WBC: 13.1 10*3/uL — ABNORMAL HIGH (ref 4.0–10.5)

## 2013-06-14 LAB — CREATININE, URINE, RANDOM: Creatinine, Urine: 176.41 mg/dL

## 2013-06-14 MED ORDER — ASPIRIN 325 MG PO TABS
325.0000 mg | ORAL_TABLET | Freq: Every day | ORAL | Status: DC
  Administered 2013-06-14 – 2013-06-15 (×2): 325 mg via ORAL
  Filled 2013-06-14 (×2): qty 1

## 2013-06-14 MED ORDER — ADULT MULTIVITAMIN W/MINERALS CH
1.0000 | ORAL_TABLET | Freq: Every day | ORAL | Status: DC
  Administered 2013-06-14 – 2013-06-15 (×2): 1 via ORAL
  Filled 2013-06-14 (×2): qty 1

## 2013-06-14 MED ORDER — SODIUM CHLORIDE 0.9 % NICU IV INFUSION SIMPLE: INJECTION | INTRAVENOUS | Status: DC

## 2013-06-14 MED ORDER — LORAZEPAM 2 MG/ML IJ SOLN: 1.0000 mg | Freq: Four times a day (QID) | INTRAMUSCULAR | Status: DC | PRN

## 2013-06-14 MED ORDER — ASPIRIN 325 MG PO TABS: 325.0000 mg | ORAL_TABLET | Freq: Every day | ORAL | Status: DC

## 2013-06-14 MED ORDER — HEPARIN SODIUM (PORCINE) 5000 UNIT/ML IJ SOLN
5000.0000 [IU] | Freq: Three times a day (TID) | INTRAMUSCULAR | Status: DC
  Administered 2013-06-14 – 2013-06-15 (×5): 5000 [IU] via SUBCUTANEOUS
  Filled 2013-06-14 (×9): qty 1

## 2013-06-14 MED ORDER — FOLIC ACID 1 MG PO TABS
1.0000 mg | ORAL_TABLET | Freq: Every day | ORAL | Status: DC
  Administered 2013-06-14 – 2013-06-15 (×2): 1 mg via ORAL
  Filled 2013-06-14 (×2): qty 1

## 2013-06-14 MED ORDER — ZOLPIDEM TARTRATE 5 MG PO TABS
5.0000 mg | ORAL_TABLET | Freq: Once | ORAL | Status: AC
  Administered 2013-06-14: 5 mg via ORAL
  Filled 2013-06-14: qty 1

## 2013-06-14 MED ORDER — INFLUENZA VAC SPLIT QUAD 0.5 ML IM SUSP
0.5000 mL | INTRAMUSCULAR | Status: DC
  Filled 2013-06-14: qty 0.5

## 2013-06-14 MED ORDER — LORAZEPAM 2 MG/ML IJ SOLN: 0.0000 mg | Freq: Four times a day (QID) | INTRAMUSCULAR | Status: DC

## 2013-06-14 MED ORDER — LORAZEPAM 2 MG/ML IJ SOLN: 0.0000 mg | Freq: Two times a day (BID) | INTRAMUSCULAR | Status: DC

## 2013-06-14 MED ORDER — SODIUM CHLORIDE 0.9 % IV SOLN
INTRAVENOUS | Status: DC
  Administered 2013-06-14 – 2013-06-15 (×4): via INTRAVENOUS

## 2013-06-14 MED ORDER — PNEUMOCOCCAL VAC POLYVALENT 25 MCG/0.5ML IJ INJ
0.5000 mL | INJECTION | INTRAMUSCULAR | Status: DC
  Filled 2013-06-14: qty 0.5

## 2013-06-14 MED ORDER — LORAZEPAM 1 MG PO TABS
1.0000 mg | ORAL_TABLET | Freq: Three times a day (TID) | ORAL | Status: DC | PRN
  Administered 2013-06-14: 1 mg via ORAL
  Filled 2013-06-14: qty 1

## 2013-06-14 MED ORDER — ASPIRIN 81 MG PO CHEW
324.0000 mg | CHEWABLE_TABLET | Freq: Once | ORAL | Status: AC
  Administered 2013-06-14: 324 mg via ORAL
  Filled 2013-06-14: qty 4

## 2013-06-14 MED ORDER — THIAMINE HCL 100 MG/ML IJ SOLN
100.0000 mg | Freq: Every day | INTRAMUSCULAR | Status: DC
  Filled 2013-06-14 (×2): qty 1

## 2013-06-14 MED ORDER — VITAMIN B-1 100 MG PO TABS
100.0000 mg | ORAL_TABLET | Freq: Every day | ORAL | Status: DC
  Administered 2013-06-14 – 2013-06-15 (×2): 100 mg via ORAL
  Filled 2013-06-14 (×2): qty 1

## 2013-06-14 MED ORDER — LORAZEPAM 1 MG PO TABS: 1.0000 mg | ORAL_TABLET | Freq: Four times a day (QID) | ORAL | Status: DC | PRN

## 2013-06-14 NOTE — H&P (Signed)
[facility] History and Physical  Guy Mcgee LKG:[id number] DOB: 02-07-1956    PCP:   Kristian Covey, MD   Chief Complaint: Right sided weakness.  HPI: Guy Mcgee is an 57 y.o. 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  Ictus was about 6 hours PTA.  He denied blurry vision, HA, nausea or vomiting.  He has no facial numbness or slurred speech.  He was not able to grasp with his usual strength.  He also described that he had problem with dorsiflexion of his right wrist and right foot.  There are also "tingling" sensation of his right upper and lower extremities.  He has no fever, chills, stiff neck, or diplopia.  He admitted to drinking significantly an unquantifiable amount of alcohol, and took a "sleeping pill", but denied any other illicit drug use.  Evaluation in the ER showed tachycardia with HR of 130, BP 144/90, and a negative head CT scan.  His LFTs were in the 200-300's range, and Cr of 2.04, with normal serum Na.  UDS is pending. His troponin was negative and his alcohol level was negative as well.  Neurology was consulted, and hostpitalist was asked to admit him for further CVA work up.  Rewiew of Systems:  Constitutional: Negative for malaise, fever and chills. No significant weight loss or weight gain Eyes: Negative for eye pain, redness and discharge, diplopia, visual changes, or flashes of light. ENMT: Negative for ear pain, hoarseness, nasal congestion, sinus pressure and sore throat. No headaches; tinnitus, drooling, or problem swallowing. Cardiovascular: Negative for chest pain, palpitations, diaphoresis, dyspnea and peripheral edema. ; No orthopnea, PND Respiratory: Negative for cough, hemoptysis, wheezing and stridor. No pleuritic chestpain. Gastrointestinal: Negative for nausea, vomiting, diarrhea,  constipation, abdominal pain, melena, blood in stool, hematemesis, jaundice and rectal bleeding.    Genitourinary: Negative for frequency, dysuria, incontinence,flank pain and hematuria; Musculoskeletal: Negative for back pain and neck pain. Negative for swelling and trauma.;  Skin: . Negative for pruritus, rash, abrasions, bruising and skin lesion.; ulcerations Neuro: Negative for headache, lightheadedness and neck stiffness. Negative for weakness, altered level of consciousness ,burning feet, involuntary movement, seizure and syncope.  Psych: negative for insomnia, tearfulness, panic attacks, hallucinations, paranoia, suicidal or homicidal ideation   Past Medical History  Diagnosis Date  . Testosterone deficiency     Past Surgical History  Procedure Laterality Date  . Inguinal herniorrhapy      Medications:  HOME MEDS: Prior to Admission medications   Medication Sig Start Date End Date Taking? Authorizing Provider  Testosterone (ANDROGEL PUMP) 12.5 MG/ACT (1%) GEL Apply 8 sprays topically daily. Apply 4 pumps to each upper arm daily (8 pumps per day) 01/24/13   Kristian Covey, MD     Allergies:  No Known Allergies  Social History:   reports that he has never smoked. He does not have any smokeless tobacco history on file. He reports that he drinks alcohol. He reports that he does not use illicit drugs.  Family History: Family History  Problem Relation Age of Onset  . Cancer Other     breast  . Diabetes Other   . Hypertension Other      Physical Exam: Filed Vitals:   06/13/13 2305  BP: 142/91  Pulse: 135  Temp: 98.5 F (36.9 C)  TempSrc: Oral  Resp: 16  SpO2: 100%   Blood pressure  142/91, pulse 135, temperature 98.5 F (36.9 C), temperature source Oral, resp. rate 16, SpO2 100.00%.  GEN:  Pleasant  patient lying in the stretcher in no acute distress; cooperative with exam. PSYCH:  alert and oriented x4; does not appear anxious or depressed; affect is  appropriate. HEENT: Mucous membranes pink and anicteric; PERRLA; EOM intact; no cervical lymphadenopathy nor thyromegaly or carotid bruit; no JVD; There were no stridor. Neck is very supple. Breasts:: Not examined CHEST WALL: No tenderness CHEST: Normal respiration, clear to auscultation bilaterally.  HEART: Regular rate and rhythm.  There are no murmur, rub, or gallops.   BACK: No kyphosis or scoliosis; no CVA tenderness ABDOMEN: soft and non-tender; no masses, no organomegaly, normal abdominal bowel sounds; no pannus; no intertriginous candida. There is no rebound and no distention. Rectal Exam: Not done EXTREMITIES: No bone or joint deformity; age-appropriate arthropathy of the hands and knees; no edema; no ulcerations.  There is no calf tenderness. Genitalia: not examined PULSES: 2+ and symmetric SKIN: Normal hydration no rash or ulceration CNS: Cranial nerves 2-12 grossly intact no focal lateralizing neurologic deficit.  Speech is fluent; uvula elevated with phonation, facial symmetry and tongue midline. DTR are normal bilaterally, cerebella exam is intact, barbinski is negative and strengths are strong bilaterally, but hand grasp and dorsiflexion is weaker on the right side. No sensory loss.  Visual field is full to confrontation.   Labs on Admission:  Basic Metabolic Panel:  Recent Labs Lab 06/13/13 2301  NA 133*  K 4.0  CL 99  CO2 21  GLUCOSE 125*  BUN 33*  CREATININE 2.04*  CALCIUM 8.8   Liver Function Tests:  Recent Labs Lab 06/13/13 2301  AST 340*  ALT 203*  ALKPHOS 70  BILITOT 0.5  PROT 7.2  ALBUMIN 4.0   No results found for this basename: LIPASE, AMYLASE,  in the last 168 hours No results found for this basename: AMMONIA,  in the last 168 hours CBC:  Recent Labs Lab 06/13/13 2301  WBC 16.6*  NEUTROABS 13.4*  HGB 17.3*  HCT 50.5  MCV 90.3  PLT 227   Cardiac Enzymes:  Recent Labs Lab 06/13/13 2301  TROPONINI <0.30    CBG:  Recent Labs Lab  06/13/13 2315  GLUCAP 186*     Radiological Exams on Admission: Ct Head Wo Contrast  06/13/2013   CLINICAL DATA:  Right-sided weakness.  EXAM: CT HEAD WITHOUT CONTRAST  TECHNIQUE: Contiguous axial images were obtained from the base of the skull through the vertex without intravenous contrast.  COMPARISON:  None.  FINDINGS: Normal appearing cerebral hemispheres and posterior fossa structures. Normal size and position of the ventricles. No intracranial hemorrhage, mass lesion or CT evidence of acute infarction. Unremarkable bones and included paranasal sinuses.  IMPRESSION: Normal examination.   Electronically Signed   By: Gordan Payment M.D.   On: 06/13/2013 23:35    EKG: Independently reviewed. Sinus tach with no acute ST-T changes.   Assessment/Plan Present on Admission:  . Right hemiplegia . Alcohol abuse, episodic drinking behavior . Anxiety and depression . TESTICULAR HYPOFUNCTION Tachycardia. Volume depletion.  PLAN:  Will admit him for acute CVA, though given the history and physical, it is also possible that he has a conversion phenomenon.  He has severe tachycardia, and I suspect he has severe anxiety, or alcohol withdrawal, but he said he doesn't drink on any regular basis.  He also denied amphetamine like substance use.  He has AKI with Cr of 2.0, which is  new for him, and could be volume depleted.  Will admit and transfer to [facility] for further work up.  Will get MRI/MRA without contrast of the brain, ECHO, carotid doppler.  OT/PT ordered as well.  He will be given IVF, along with ASA.  Thank you for allowing me to participate in his care.  Other plans as per orders.  Code Status: FULL Unk Lightning, MD. [facility] Pager [phone] 7pm to 7am.  06/14/2013, 1:04 AM

## 2013-06-14 NOTE — Progress Notes (Signed)
Neuro hospitalist paged to notify of patients arrival. No new orders given.

## 2013-06-14 NOTE — Progress Notes (Signed)
*  PRELIMINARY RESULTS* Vascular Ultrasound Carotid Duplex (Doppler) has been completed.  Preliminary findings: Bilateral:  1-39% ICA stenosis.  Vertebral artery flow is antegrade.      Farrel Demark, RDMS, RVT  06/14/2013, 8:42 AM

## 2013-06-14 NOTE — Progress Notes (Signed)
  Patient seen and evaluated earlier this Am by my associate.  Please refer to his H and P for details regarding the Assessment and Plan.  Will reassess next am. Patient seen and in NAD.   Guy Mcgee, [employer]

## 2013-06-14 NOTE — Progress Notes (Signed)
Pt returned to unit from MRI CMT notified spoke with Archie Patten

## 2013-06-14 NOTE — ED Notes (Signed)
MD at bedside. 

## 2013-06-14 NOTE — Progress Notes (Signed)
Occupational Therapy Treatment Patient Details Name: Guy Mcgee MRN: [id number] DOB: July 11, 1956 Today's Date: 06/14/2013 Time: 4098-1191 OT Time Calculation (min): 14 min  OT Assessment / Plan / Recommendation  History of present illness 57 y.o. male admitted with right sided weakness.    OT comments  Session focused on performing further assessment of pt's symptoms. Pt reported tingling on medial side of right leg as well as on dorsal side of right arm/hand. Pt c/o numbness at the cranial/cervical junction as well. Pt appears to demonstrate radial nerve dysfunction as he is complaining of RLE weakness and sensory deficits medially. He also has weak wrist extensors, thumb extension, and forearm supination. Given the ipsilateral symptoms, may want to consider doing a scan of spinal cord.   Follow Up Recommendations  CIR;Supervision - Intermittent    Barriers to Discharge       Equipment Recommendations  3 in 1 bedside comode    Recommendations for Other Services Rehab consult  Frequency Min 2X/week   Progress towards OT Goals    Plan Discharge plan needs to be updated    Precautions / Restrictions Precautions Precautions: Fall Restrictions Weight Bearing Restrictions: No   Pertinent Vitals/Pain No pain reported.     ADL    ADL Comments: Session focused on further assessment of pt's presenting symptoms. Educated to position RUE on pillow.    OT Diagnosis: Other (comment);Acute pain (decreased balance; weakness on right side)  OT Problem List: Decreased strength;Impaired vision/perception;Impaired balance (sitting and/or standing);Decreased knowledge of use of DME or AE;Decreased knowledge of precautions;Pain;Impaired UE functional use;Impaired sensation OT Treatment Interventions: Self-care/ADL training;Therapeutic exercise;DME and/or AE instruction;Therapeutic activities;Patient/family education;Visual/perceptual remediation/compensation;Balance training;Neuromuscular  education   OT Goals(current goals can now be found in the care plan section) Acute Rehab OT Goals Patient Stated Goal: not stated OT Goal Formulation: With patient Time For Goal Achievement: 06/21/13 Potential to Achieve Goals: Good ADL Goals Pt Will Perform Lower Body Bathing: with modified independence;sit to/from stand Pt Will Perform Lower Body Dressing: with modified independence;sit to/from stand Pt Will Transfer to Toilet: with modified independence;ambulating (3 in 1 over commode) Pt Will Perform Toileting - Clothing Manipulation and hygiene: with modified independence;sit to/from stand Pt Will Perform Tub/Shower Transfer: Shower transfer;with supervision;ambulating;rolling walker (shower equipment tbd) Additional ADL Goal #1: Pt will participate in further vision assessment. Additional ADL Goal #2: Pt will participate in further assessment of RUE.  Additional ADL Goal #3: Pt will be independent in positioning RUE.  Visit Information  Last OT Received On: 06/14/13 Assistance Needed: +1 History of Present Illness: 57 y.o. male admitted with right sided weakness.     Subjective Data      Prior Functioning  Home Living Family/patient expects to be discharged to:: Private residence Living Arrangements: Alone Available Help at Discharge: Friend(s);Family;Available 24 hours/day;Other (Comment) (reports he can get someone to stay with him) Type of Home: House Home Access: Stairs to enter [employer] of Steps: 2 (But most of the rooms are on the first floor) Home Layout: Two level;Able to live on main level with bedroom/bathroom Home Equipment: Gilmer Mor - single point Prior Function Level of Independence: Independent Communication Communication: No difficulties Dominant Hand: Right    Cognition  Cognition Arousal/Alertness: Awake/alert Behavior During Therapy: WFL for tasks assessed/performed Overall Cognitive Status: Within Functional Limits for tasks assessed     Mobility  Bed Mobility Bed Mobility: Not assessed  Transfers Transfers: Not assessed   Exercises      Balance     End  of Session OT - End of Session Equipment Utilized During Treatment: Gait belt;Rolling walker Activity Tolerance: Patient tolerated treatment well Patient left: in bed;with call bell/phone within reach;with family/visitor present  GO     Earlie Raveling OTR/L [phone] 06/14/2013, 3:57 PM

## 2013-06-14 NOTE — Evaluation (Signed)
Physical Therapy Evaluation Patient Details Name: Guy Mcgee MRN: [id number] DOB: Dec 24, 1955 Today's Date: 06/14/2013 Time: 4098-1191 PT Time Calculation (min): 24 min  PT Assessment / Plan / Recommendation History of Present Illness  57 y.o. male admitted with right sided weakness.   Clinical Impression  Pt admitted with above. Pt currently with functional limitations due to the deficits listed below (see PT Problem List). Hemiplegia persistent through Right lower extremity, triceps, and wrist extensors.  Patient complains of numbness to the back of his head and reports he has notified the nurse and doctor. Pt will benefit from skilled PT to increase their independence and safety with mobility and functional activities to allow discharge to Inpatient Rehab.     PT Assessment  Patient needs continued PT services    Follow Up Recommendations  CIR          Equipment Recommendations   (TBD by progression)       Frequency Min 4X/week    Precautions / Restrictions Precautions Precautions: Fall Restrictions Weight Bearing Restrictions: No   Pertinent Vitals/Pain None reported      Mobility  Bed Mobility Bed Mobility: Supine to Sit Supine to Sit: 5: Supervision Details for Bed Mobility Assistance: increased time required due to flaccidity in R UE and LE Transfers Transfers: Sit to Stand;Stand to Sit Sit to Stand: 4: Min guard;With upper extremity assist;From bed Stand to Sit: 4: Min assist;With upper extremity assist;To bed Details for Transfer Assistance: extra time required due to leg weakness.  Lacks eccentric control for lowering Ambulation/Gait Ambulation/Gait Assistance: 4: Min guard Ambulation Distance (Feet): 100 Feet Assistive device: Rolling walker Ambulation/Gait Assistance Details: Patient had decreased hip flexion to clear his foot. Requires frequent cuing to keep his head upright and to maintain proper posture.   Gait Pattern: Step-through  pattern;Decreased stride length;Decreased dorsiflexion - right;Decreased weight shift to right;Decreased hip/knee flexion - right Gait velocity: decreased    Exercises     PT Diagnosis: Difficulty walking;Generalized weakness;Hemiplegia dominant side  PT Problem List: Decreased mobility;Decreased balance;Decreased activity tolerance;Decreased coordination;Decreased strength PT Treatment Interventions: Gait training;Therapeutic activities;Therapeutic exercise;Balance training;Neuromuscular re-education;Stair training     PT Goals(Current goals can be found in the care plan section) Acute Rehab PT Goals Patient Stated Goal: not stated PT Goal Formulation: With patient Time For Goal Achievement: 06/28/13 Potential to Achieve Goals: Good  Visit Information  Last PT Received On: 06/14/13 Assistance Needed: +1 History of Present Illness: 57 y.o. male admitted with right sided weakness.        Prior Functioning  Home Living Family/patient expects to be discharged to:: Private residence Living Arrangements: Alone Available Help at Discharge: Friend(s);Family;Available 24 hours/day;Other (Comment) (reports he can get someone to stay with him) Type of Home: House Home Access: Stairs to enter [employer] of Steps: 2 (But most of the rooms are on the first floor) Home Layout: Two level;Able to live on main level with bedroom/bathroom Home Equipment: [employer] - single point Prior Function Level of Independence: Independent Communication Communication: No difficulties Dominant Hand: Right    Cognition  Cognition Arousal/Alertness: Awake/alert Behavior During Therapy: WFL for tasks assessed/performed Overall Cognitive Status: Within Functional Limits for tasks assessed    Extremity/Trunk Assessment Upper Extremity Assessment Upper Extremity Assessment: Defer to OT evaluation RUE Deficits / Details: 4/5 elbow extension; weak grasp; significant weakness in wrist extension RUE  Coordination: decreased fine motor;decreased gross motor Lower Extremity Assessment Lower Extremity Assessment: RLE deficits/detail RLE Deficits / Details: weakness in R LE evident with  bed mobility and MMT. Knee extension(3+/5), knee flexion(3+/5), hip flexion(3/5).  Sensation loss along medial thigh and calf(L3 and 4 dermatome)   Balance    End of Session PT - End of Session Equipment Utilized During Treatment: Gait belt Activity Tolerance: Patient tolerated treatment well Patient left: in bed;with call bell/phone within reach Nurse Communication: Mobility status  GP    Barrie Dunker, SPT Pager:  [phone]   Barrie Dunker 06/14/2013, 3:37 PM

## 2013-06-14 NOTE — Evaluation (Signed)
Agree with SPT.    Ailin Rochford, PT [phone]  

## 2013-06-14 NOTE — Progress Notes (Signed)
Rehab Admissions Coordinator Note:  Patient was screened by Trish Mage for appropriateness for an Inpatient Acute Rehab Consult.  Noted PT recommending CIR and OT recommending HH.  At this time, we are recommending HH.  I think patient is doing too well to meet criteria for acute inpatient rehab stay.  Call me for questions.  Trish Mage 06/14/2013, 3:55 PM  I can be reached at [phone].

## 2013-06-14 NOTE — Consult Note (Addendum)
Referring Physician: Regalado    Chief Complaint: Right sided weakness  HPI: Guy Mcgee is a RH 57 y.o. male who has been under a lot of stress recently. He reports that he has been having a lot of trouble sleeping secondary to the stress.  The night before last he had something to drink and took a sleeping pill.  When he lid down to nap yesterday afternoon he was doing fine.  When he awakened his right side was weak and numb.  He had to have someone bring him to the hospital.  Date last known well: Date: 06/13/2013 Time last known well: Time: 12:00 tPA Given: No: Outside time window  Past Medical History  Diagnosis Date  . Testosterone deficiency     Past Surgical History  Procedure Laterality Date  . Inguinal herniorrhapy      Family History  Problem Relation Age of Onset  . Cancer Other     breast  . Diabetes Other   . Hypertension Other    Social History:  reports that he has never smoked. He does not have any smokeless tobacco history on file. He reports that he drinks alcohol. He reports that he does not use illicit drugs. He is a Education officer, environmental.  Allergies: No Known Allergies  Medications:  I have reviewed the patient's current medications. Prior to Admission:  Prescriptions prior to admission  Medication Sig Dispense Refill  . Testosterone (ANDROGEL PUMP) 12.5 MG/ACT (1%) GEL Apply 8 sprays topically daily. Apply 4 pumps to each upper arm daily (8 pumps per day)  300 g  5   Scheduled: . aspirin  325 mg Oral Daily  . folic acid  1 mg Oral Daily  . heparin  5,000 Units Subcutaneous Q8H  . LORazepam  0-4 mg Intravenous Q6H   Followed by  . [START ON 06/16/2013] LORazepam  0-4 mg Intravenous Q12H  . multivitamin with minerals  1 tablet Oral Daily  . thiamine  100 mg Oral Daily   Or  . thiamine  100 mg Intravenous Daily    ROS: History obtained from the patient  General ROS: negative for - chills, fatigue, fever, night sweats, weight gain or weight  loss Psychological ROS: depression Ophthalmic ROS: negative for - blurry vision, double vision, eye pain or loss of vision ENT ROS: negative for - epistaxis, nasal discharge, oral lesions, sore throat, tinnitus or vertigo Allergy and Immunology ROS: negative for - hives or itchy/watery eyes Hematological and Lymphatic ROS: negative for - bleeding problems, bruising or swollen lymph nodes Endocrine ROS: negative for - galactorrhea, hair pattern changes, polydipsia/polyuria or temperature intolerance Respiratory ROS: negative for - cough, hemoptysis, shortness of breath or wheezing Cardiovascular ROS: negative for - chest pain, dyspnea on exertion, edema or irregular heartbeat Gastrointestinal ROS: negative for - abdominal pain, diarrhea, hematemesis, nausea/vomiting or stool incontinence Genito-Urinary ROS: negative for - dysuria, hematuria, incontinence or urinary frequency/urgency Musculoskeletal ROS: occasional back pain Neurological ROS: as noted in HPI Dermatological ROS: negative for rash and skin lesion changes  Physical Examination: Blood pressure 136/94, pulse 114, temperature 99.3 F (37.4 C), temperature source Oral, resp. rate 20, height 5\' 11"  (1.803 m), weight 100.5 kg (221 lb 9 oz), SpO2 98.00%.  Neurologic Examination: Mental Status: Alert, oriented, thought content appropriate.  Speech fluent without evidence of aphasia.  Able to follow 3 step commands without difficulty. Cranial Nerves: II: Discs flat bilaterally; Visual fields grossly normal, pupils equal, round, reactive to light and accommodation III,IV, VI: ptosis not  present, extra-ocular motions intact bilaterally V,VII: decreased right NLF, facial light touch sensation normal bilaterally VIII: hearing normal bilaterally IX,X: gag reflex present XI: bilateral shoulder shrug XII: midline tongue extension Motor: 5/5 on the left.  With RUE initially gives 4/5 strength.  When the muscles are isolated the patient  gives 5/5 strength with weakness only noted distally with 2-3/5 wrist extension and 1-2/5wrist inversion, wrist eversion in 5/5.  Hand grip 5-/5.  Initially patient unable to lift the right leg off the bed but when attempting to lift the right the left was easily lifted off the bed actively and eventually the patient was sustaining both legs off the bed simultaneously.   Sensory: Pinprick and light touch decreased on the back of the neck on the right, in the right upper and right lower extremity Deep Tendon Reflexes: 2+ with a trace right KJ Plantars: Right: upgoing   Left: upgoing Cerebellar: normal finger-to-nose and normal heel-to-shin testing bilaterally Gait: unable to test CV: pulses palpable throughout     Laboratory Studies:  Basic Metabolic Panel:  Recent Labs Lab 06/13/13 2301  NA 133*  K 4.0  CL 99  CO2 21  GLUCOSE 125*  BUN 33*  CREATININE 2.04*  CALCIUM 8.8    Liver Function Tests:  Recent Labs Lab 06/13/13 2301  AST 340*  ALT 203*  ALKPHOS 70  BILITOT 0.5  PROT 7.2  ALBUMIN 4.0   No results found for this basename: LIPASE, AMYLASE,  in the last 168 hours No results found for this basename: AMMONIA,  in the last 168 hours  CBC:  Recent Labs Lab 06/13/13 2301 06/14/13 0415  WBC 16.6* 13.1*  NEUTROABS 13.4*  --   HGB 17.3* 15.7  HCT 50.5 46.0  MCV 90.3 90.7  PLT 227 201    Cardiac Enzymes:  Recent Labs Lab 06/13/13 2301  TROPONINI <0.30    BNP: No components found with this basename: POCBNP,   CBG:  Recent Labs Lab 06/13/13 2315  GLUCAP 186*    Microbiology: No results found for this or any previous visit.  Coagulation Studies:  Recent Labs  06/13/13 2301  LABPROT 12.7  INR 0.97    Urinalysis: No results found for this basename: COLORURINE, APPERANCEUR, LABSPEC, PHURINE, GLUCOSEU, HGBUR, BILIRUBINUR, KETONESUR, PROTEINUR, UROBILINOGEN, NITRITE, LEUKOCYTESUR,  in the last 168 hours  Lipid Panel:    Component Value  Date/Time   CHOL 206* 12/08/2011 1054   TRIG 77.0 12/08/2011 1054   HDL 49.40 12/08/2011 1054   CHOLHDL 4 12/08/2011 1054   VLDL 15.4 12/08/2011 1054    ZOXW9U:  No results found for this basename: HGBA1C    Urine Drug Screen:   No results found for this basename: labopia, cocainscrnur, labbenz, amphetmu, thcu, labbarb    Alcohol Level:  Recent Labs Lab 06/13/13 2301  ETH <11    Other results: EKG: sinus tachycardia at 128 bpm.  Imaging: Ct Head Wo Contrast  06/13/2013   CLINICAL DATA:  Right-sided weakness.  EXAM: CT HEAD WITHOUT CONTRAST  TECHNIQUE: Contiguous axial images were obtained from the base of the skull through the vertex without intravenous contrast.  COMPARISON:  None.  FINDINGS: Normal appearing cerebral hemispheres and posterior fossa structures. Normal size and position of the ventricles. No intracranial hemorrhage, mass lesion or CT evidence of acute infarction. Unremarkable bones and included paranasal sinuses.  IMPRESSION: Normal examination.   Electronically Signed   By: Gordan Payment M.D.   On: 06/13/2013 23:35    Assessment: 57  y.o. male without a history of stroke risk factors who presents with acute onset right sided numbness and weakness.  Neurological examination consists of many inconsistencies but patient does have bilateral upgoing toes.  There is a possibility of pressure palsy versus acute infarct.  CT of the head reviewed and shows no acute changes.  Further work up recommended.  Stroke Risk Factors - none  Plan: 1. HgbA1c, fasting lipid panel 2. MRI, MRA  of the brain without contrast 3. PT consult, OT consult, Speech consult 4. Echocardiogram 5. Carotid dopplers 6. Prophylactic therapy-Antiplatelet med: Aspirin - dose 325mg  daily 7. Risk factor modification 8. Telemetry monitoring 9. Frequent neuro checks   Thana Farr, MD [facility] [phone] 06/14/2013, 6:05 AM

## 2013-06-14 NOTE — Evaluation (Addendum)
Occupational Therapy Evaluation Patient Details Name: Guy Mcgee MRN: [id number] DOB: 06/22/1956 Today's Date: 06/14/2013 Time: 0272-5366 OT Time Calculation (min): 22 min  OT Assessment / Plan / Recommendation History of present illness 57 y.o. male admitted with right sided weakness.    Clinical Impression   Pt presents with below problem list. Pt with significant weakness in right wrist extensors. Pt will benefit from acute OT to increase independence prior to d/c.     OT Assessment  Patient needs continued OT Services    Follow Up Recommendations  Home health OT;Supervision/Assistance - 24 hour    Barriers to Discharge      Equipment Recommendations  3 in 1 bedside comode    Recommendations for Other Services    Frequency  Min 2X/week    Precautions / Restrictions Precautions Precautions: Fall Restrictions Weight Bearing Restrictions: No   Pertinent Vitals/Pain Pain in buttocks area. Reports he fell on it.     ADL  Grooming: Wash/dry face;Wash/dry hands;Min guard;Other (comment);Set up;Supervision/safety (Min guard for tasks-however feel he would be at setup for other grooming tasks.) Where Assessed - Grooming: Supported standing Upper Body Dressing: Minimal assistance Where Assessed - Upper Body Dressing: Unsupported sitting Lower Body Dressing: Minimal assistance Where Assessed - Lower Body Dressing: Supported sit to stand Toilet Transfer: Min Sports administrator Method: Sit to stand;Other (comment) (stand to sit) Acupuncturist: Comfort height toilet;Grab bars Toileting - Clothing Manipulation and Hygiene: Minimal assistance Where Assessed - Engineer, mining and Hygiene: Sit to stand from 3-in-1 or toilet Tub/Shower Transfer Method: Not assessed Equipment Used: Gait belt;Rolling walker Transfers/Ambulation Related to ADLs: Min guard for ambulation and Min A/Min guard for transfers. Cues to roll walker.  ADL  Comments: Educated on dressing technique and to sit to get LB clothing on and then stand in front of chair/bed with walker in front when pulling up LB clothing.  Educated to sit to bathe for safety. Educated to be aware of where his right hand is so that he does not injure it. Educated to be using right hand during activities and also spoke with him about fine motor activities.    OT Diagnosis: Other (comment);Acute pain (decreased balance; weakness on right side)  OT Problem List: Decreased strength;Impaired vision/perception;Impaired balance (sitting and/or standing);Decreased knowledge of use of DME or AE;Decreased knowledge of precautions;Pain;Impaired UE functional use;Impaired sensation OT Treatment Interventions: Self-care/ADL training;Therapeutic exercise;DME and/or AE instruction;Therapeutic activities;Patient/family education;Visual/perceptual remediation/compensation;Balance training;Neuromuscular education   OT Goals(Current goals can be found in the care plan section) Acute Rehab OT Goals Patient Stated Goal: not stated OT Goal Formulation: With patient Time For Goal Achievement: 06/21/13 Potential to Achieve Goals: Good ADL Goals Pt Will Perform Lower Body Bathing: with modified independence;sit to/from stand Pt Will Perform Lower Body Dressing: with modified independence;sit to/from stand Pt Will Transfer to Toilet: with modified independence;ambulating (3 in 1 over commode) Pt Will Perform Toileting - Clothing Manipulation and hygiene: with modified independence;sit to/from stand Pt Will Perform Tub/Shower Transfer: Shower transfer;with supervision;ambulating;rolling walker (shower equipment tbd) Additional ADL Goal #1: Pt will participate in further vision assessment.  Visit Information  Last OT Received On: 06/14/13 Assistance Needed: +1 History of Present Illness: 57 y.o. male admitted with right sided weakness.        Prior Functioning     Home Living Family/patient  expects to be discharged to:: Private residence Living Arrangements: Alone Available Help at Discharge: Friend(s);Family;Available 24 hours/day;Other (Comment) (reports he can get someone to stay with  him) Type of Home: House Home Access: Stairs to enter [employer] of Steps: 2 Home Layout: Two level;Able to live on main level with bedroom/bathroom Home Equipment: Cane - single point Prior Function Level of Independence: Independent Communication Communication: No difficulties Dominant Hand: Right         Vision/Perception Vision - History Patient Visual Report: No change from baseline Vision - Assessment Vision Assessment: Vision tested Tracking/Visual Pursuits: Able to track stimulus in all quads without difficulty Visual Fields: Other (comment) (inconsistent in left upper quadrant)   Cognition  Cognition Arousal/Alertness: Awake/alert Behavior During Therapy: WFL for tasks assessed/performed Overall Cognitive Status: Within Functional Limits for tasks assessed    Extremity/Trunk Assessment Upper Extremity Assessment Upper Extremity Assessment: RUE deficits/detail RUE Deficits / Details: 4/5 elbow extension; weak grasp; significant weakness in wrist extension RUE Coordination: decreased fine motor;decreased gross motor Lower Extremity Assessment Lower Extremity Assessment: Defer to PT evaluation (numbness in RLE)     Mobility Bed Mobility Bed Mobility: Supine to Sit Supine to Sit: 5: Supervision Transfers Transfers: Sit to Stand;Stand to Sit Sit to Stand: 4: Min guard;With upper extremity assist;From bed;From toilet Stand to Sit: 4: Min guard;4: Min assist;With upper extremity assist;To toilet;To chair/3-in-1 Details for Transfer Assistance: Cues for technique. Min A for stand to sit to toilet.     Exercise     Balance     End of Session OT - End of Session Equipment Utilized During Treatment: Gait belt;Rolling walker Activity Tolerance: Patient  tolerated treatment well Patient left: in chair;with call bell/phone within reach  [employer]  OTR/L [phone]  06/14/2013, 1:48 PM

## 2013-06-14 NOTE — Progress Notes (Signed)
  Echocardiogram 2D Echocardiogram has been performed.  Guy Mcgee 06/14/2013, 8:28 AM

## 2013-06-14 NOTE — Progress Notes (Signed)
Follow-up:  I was asked to see pt s/p transfer from [facility] to [facility] room 4N-04.  Guy Mcgee is a 57 y/o male who presented to [facility] last evening w/ c/o (R) sided weakness. Ct head w/o acute findings. Pt subsequently admitted for acute CVA. Neuro-hospitalists consulted and pt transferred to [facility] for further evaluation by neurology. At bedside pt is noted resting awake but in NAD. He currently denies c/o's. Pt remains tachycardic, remaining VSS. Will continue to monitor closely.  Leanne Chang, NP-C [facility] Pager [phone]

## 2013-06-15 DIAGNOSIS — G5630 Lesion of radial nerve, unspecified upper limb: Principal | ICD-10-CM

## 2013-06-15 LAB — GLUCOSE, CAPILLARY
Glucose-Capillary: 89 mg/dL (ref 70–99)
Glucose-Capillary: 81 mg/dL (ref 70–99)

## 2013-06-15 LAB — URINE CULTURE

## 2013-06-15 NOTE — Progress Notes (Signed)
Physical Therapy Treatment Patient Details Name: Guy Mcgee MRN: [id number] DOB: Oct 02, 1955 Today's Date: 06/15/2013 Time: 0752-0804 PT Time Calculation (min): 12 min  PT Assessment / Plan / Recommendation  History of Present Illness 57 y.o. male admitted with right sided weakness.    PT Comments   Patient reports that he is starting to feel better, and that his numbness is starting to decrease on his calf and his hand.  Patient is able to voluntarily move his LE through functional tasks with increased time and effort.  With cuing and assistance he is able to walk with a more normalized pattern today.  Patient will still benefit from skilled PT intervention to address mobility and safety as he is discharged to home.  Will attempt to ambulate with a less restrictive AD tomorrow if his presentation permits.    Follow Up Recommendations  Home health PT           Equipment Recommendations   (TBD by progression)       Frequency Min 4X/week   Progress towards PT Goals Progress towards PT goals: Progressing toward goals  Plan Discharge plan needs to be updated    Precautions / Restrictions Precautions Precautions: Fall Restrictions Weight Bearing Restrictions: No       Mobility  Bed Mobility Bed Mobility: Supine to Sit Supine to Sit: 5: Supervision Details for Bed Mobility Assistance: increased time required due to flaccidity in R UE and LE Transfers Transfers: Stand to Sit Sit to Stand: 4: Min guard Stand to Sit: 4: Min guard Details for Transfer Assistance: V/c cues required for hand placement for safety.  Extra time required for standing due to weakness on R leg.   Ambulation/Gait Ambulation/Gait Assistance: 4: Min guard Ambulation Distance (Feet): 150 Feet Assistive device: Rolling walker Ambulation/Gait Assistance Details: Pt was cued to have excessive hip flexion and df today during gait.  Was able to perform this well and gait looks more fluid.  Had increased  upright posture without cuing today.   Gait Pattern: Decreased stride length;Step-through pattern Gait velocity: decreased      PT Goals (current goals can now be found in the care plan section) Acute Rehab PT Goals Patient Stated Goal: not stated PT Goal Formulation: With patient Time For Goal Achievement: 06/28/13 Potential to Achieve Goals: Good  Visit Information  Last PT Received On: 06/15/13 Assistance Needed: +1 History of Present Illness: 57 y.o. male admitted with right sided weakness.     Subjective Data  Patient Stated Goal: not stated   Cognition  Cognition Arousal/Alertness: Awake/alert Behavior During Therapy: WFL for tasks assessed/performed Overall Cognitive Status: Within Functional Limits for tasks assessed    Balance     End of Session PT - End of Session Equipment Utilized During Treatment: Gait belt Activity Tolerance: Patient tolerated treatment well Patient left: with call bell/phone within reach;in chair Nurse Communication: Mobility status   GP     Barrie Dunker 06/15/2013, 9:59 AM

## 2013-06-15 NOTE — Progress Notes (Addendum)
NEURO HOSPITALIST PROGRESS NOTE   SUBJECTIVE:                                                                                                                        No significant change from yesterday --possibly some increased strength with wrist extension.   OBJECTIVE:                                                                                                                           Vital signs in last 24 hours: Temp:  [97.3 F (36.3 C)-99.7 F (37.6 C)] 97.3 F (36.3 C) (11/13 1041) Pulse Rate:  [58-116] 58 (11/13 1041) Resp:  [18-20] 18 (11/13 1041) BP: (121-147)/(75-86) 137/82 mmHg (11/13 1041) SpO2:  [94 %-99 %] 98 % (11/13 1041)  Intake/Output from previous day: 11/12 0701 - 11/13 0700 In: 1840 [P.O.:840; I.V.:1000] Out: 1050 [Urine:1050] Intake/Output this shift: Total I/O In: -  Out: 400 [Urine:400] Nutritional status: Cardiac  Past Medical History  Diagnosis Date  . Testosterone deficiency       Neurologic Exam:  Mental Status: Alert, oriented, thought content appropriate.  Speech fluent without evidence of aphasia.  Able to follow 3 step commands without difficulty. Cranial Nerves: II: visual fields grossly normal, pupils equal, round, reactive to light and accommodation III,IV, VI: ptosis not present, extraocular muscles extra-ocular motions intact bilaterally V,VII: smile symmetric, facial light touch sensation normal bilaterally VIII: hearing normal bilaterally IX,X: gag reflex present XI: trapezius strength/neck flexion strength normal bilaterally XII: tongue strength normal  Motor: Right : Upper extremity    Left:     Upper extremity 5/5 deltoid       5/5 deltoid 5/5 tricep      5/5 tricep 5/5 biceps      5/5 biceps  5/5wrist flexion     5/5 wrist flexion 0/5 wrist extension     5/5 wrist extension 5/5 hand grip      5/5 hand grip 1/5 finger extension  1/5 thumb extension   Lower extremity     Lower  extremity 5/5 hip flexor      5/5 hip flexor 5/5 hip adductors     5/5 hip adductors 5/5 hip abductors     5/5 hip  abductors 5/5 quadricep      5/5 quadriceps  5/5 hamstrings     5/5 hamstrings 5/5 plantar flexion       5/5 plantar flexion 5/5 plantar extension     5/5 plantar extension Tone and bulk:normal tone throughout; no atrophy noted Sensory: Pinprick and light touch intact throughout, bilaterally Deep Tendon Reflexes:  Right: Upper Extremity   Left: Upper extremity   biceps (C-5 to C-6) 2/4   biceps (C-5 to C-6) 2/4 tricep (C7) 2/4    triceps (C7) 2/4 Brachioradialis (C6) 2/4  Brachioradialis (C6) 2/4  Lower Extremity Lower Extremity  quadriceps (L-2 to L-4) 2/4   quadriceps (L-2 to L-4) 2/4 Achilles (S1) 2/4   Achilles (S1) 2/4 Plantars: Up going bilateral  Cerebellar: normal finger-to-nose, normal heel-to-shin test   Lab Results: Lab Results  Component Value Date/Time   CHOL 160 06/14/2013  4:15 AM   Lipid Panel  Recent Labs  06/14/13 0415  CHOL 160  TRIG 203*  HDL 54  CHOLHDL 3.0  VLDL 41*  LDLCALC 65    Studies/Results: Ct Head Wo Contrast  06/13/2013   CLINICAL DATA:  Right-sided weakness.  EXAM: CT HEAD WITHOUT CONTRAST  TECHNIQUE: Contiguous axial images were obtained from the base of the skull through the vertex without intravenous contrast.  COMPARISON:  None.  FINDINGS: Normal appearing cerebral hemispheres and posterior fossa structures. Normal size and position of the ventricles. No intracranial hemorrhage, mass lesion or CT evidence of acute infarction. Unremarkable bones and included paranasal sinuses.  IMPRESSION: Normal examination.   Electronically Signed   By: Gordan Payment M.D.   On: 06/13/2013 23:35   Mri Brain Without Contrast  06/14/2013   CLINICAL DATA:  Right-sided weakness  EXAM: MRI HEAD WITHOUT CONTRAST  MRA HEAD WITHOUT CONTRAST  TECHNIQUE: Multiplanar, multiecho pulse sequences of the brain and surrounding structures were obtained  without intravenous contrast. Angiographic images of the head were obtained using MRA technique without contrast.  COMPARISON:  Head CT 06/13/2013  FINDINGS: MRI HEAD FINDINGS  The brain has normal appearance on all pulse sequences without evidence of malformation, atrophy, old or acute infarction, mass lesion, hemorrhage, hydrocephalus or extra-axial collection. Incidental cavum septum pellucidum. No pituitary mass. No inflammatory sinus disease. No skull or skullbase lesion. 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  MRA HEAD FINDINGS  Both internal carotid arteries are widely patent into the brain. The anterior and middle cerebral vessels are patent without proximal stenosis, aneurysm or vascular malformation. The right internal carotid artery supplies only the right middle cerebral artery territory. The left ICA supplies the left MCA, both anterior cerebral arteries and the left PCA.  The right vertebral artery is widely patent to the basilar. No antegrade flow is seen in a left vertebral artery. This could be congenitally small or absent or could be occluded. There is slight retrograde flow on the left probably supplying the anterior inferior cerebellar artery. No basilar stenosis. Posterior circulation branch vessels are intact, with the left PCA receiving most of its supply from the anterior circulation as noted above.  IMPRESSION: No evidence of old or acute infarction. Normal appearance the brain itself.  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 This is not likely clinically significant.  No anterior circulation pathology. Congenital variations as described above.  No antegrade flow seen in  the left vertebral artery. Most likely, this is occluded. I cannot exclude the possibility  that this is a congenital variation with an aplastic or markedly hypoplastic left vertebral artery.   Electronically Signed   By: Paulina Fusi M.D.   On: 06/14/2013 09:11   Mr Maxine Glenn Head/brain Wo Cm  06/14/2013   CLINICAL DATA:  Right-sided weakness  EXAM: MRI HEAD WITHOUT CONTRAST  MRA HEAD WITHOUT CONTRAST  TECHNIQUE: Multiplanar, multiecho pulse sequences of the brain and surrounding structures were obtained without intravenous contrast. Angiographic images of the head were obtained using MRA technique without contrast.  COMPARISON:  Head CT 06/13/2013  FINDINGS: MRI HEAD FINDINGS  The brain has normal appearance on all pulse sequences without evidence of malformation, atrophy, old or acute infarction, mass lesion, hemorrhage, hydrocephalus or extra-axial collection. Incidental cavum septum pellucidum. No pituitary mass. No inflammatory sinus disease. No skull or skullbase lesion. 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  MRA HEAD FINDINGS  Both internal carotid arteries are widely patent into the brain. The anterior and middle cerebral vessels are patent without proximal stenosis, aneurysm or vascular malformation. The right internal carotid artery supplies only the right middle cerebral artery territory. The left ICA supplies the left MCA, both anterior cerebral arteries and the left PCA.  The right vertebral artery is widely patent to the basilar. No antegrade flow is seen in a left vertebral artery. This could be congenitally small or absent or could be occluded. There is slight retrograde flow on the left probably supplying the anterior inferior cerebellar artery. No basilar stenosis. Posterior circulation branch vessels are intact, with the left PCA receiving most of its supply from the anterior circulation as noted above.  IMPRESSION: No evidence of old or acute  infarction. Normal appearance the brain itself.  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 This is not likely clinically significant.  No anterior circulation pathology. Congenital variations as described above.  No antegrade flow seen in the left vertebral artery. Most likely, this is occluded. I cannot exclude the possibility that this is a congenital variation with an aplastic or markedly hypoplastic left vertebral artery.   Electronically Signed   By: Paulina Fusi M.D.   On: 06/14/2013 09:11   Carotid doppler:  - The vertebral arteries appear patent with antegrade flow. - Findings consistent with 1-39 percent stenosis involving the right internal carotid artery and the left internal carotid artery. - ICA/CCA ratio. right=0.77. left = 0.83 Other specific details can be found in the table(s) above. Prepared and Electronically Authenticated by  2 D echo:  Study Conclusions  - Left ventricle: The cavity size was normal. There was mild concentric hypertrophy. Systolic function was normal. The estimated ejection fraction was in the range of 60% to 65%. Wall motion was normal; there were no regional wall motion abnormalities. Doppler parameters are consistent with abnormal left ventricular relaxation (grade 1 diastolic dysfunction). - Atrial septum: No defect or patent foramen ovale was identified.  LDL 65 A1c: 5.2   MEDICATIONS  Scheduled: . aspirin  325 mg Oral Daily  . folic acid  1 mg Oral Daily  . heparin  5,000 Units Subcutaneous Q8H  . influenza vac split quadrivalent PF  0.5 mL Intramuscular Tomorrow-1000  . LORazepam  0-4 mg Intravenous Q6H   Followed by  . [START ON 06/16/2013] LORazepam  0-4 mg Intravenous Q12H  . multivitamin with minerals  1 tablet Oral Daily  .  pneumococcal 23 valent vaccine  0.5 mL Intramuscular Tomorrow-1000  . thiamine  100 mg Oral Daily   Or  . thiamine  100 mg Intravenous Daily    ASSESSMENT/PLAN:                                                                                                            57 y.o. male with wrist drop likely radial pressure palsy as neurological work up is negative.   Recommend: 1) PT/OT 2)  Will need wrist brace (spoken to OT Brynn) 3)  Follow up out patient neurology for possible EMG/NCV in 6 weeks if no improvement  Neurology S/O  Assessment and plan discussed with with attending physician and they are in agreement.    Felicie Morn PA-C [facility] [phone]  06/15/2013, 1:56 PM

## 2013-06-15 NOTE — Progress Notes (Signed)
Utilization review completed. Babita Amaker, RN, BSN. 

## 2013-06-15 NOTE — Progress Notes (Signed)
Orthopedic Tech Progress Note Patient Details:  Guy Mcgee June 08, 1956 [id number]  Ortho Devices Type of Ortho Device: Velcro wrist splint Ortho Device/Splint Location: RUE Ortho Device/Splint Interventions: Ordered;Application   [name] 06/15/2013, 3:53 PM

## 2013-06-15 NOTE — Progress Notes (Signed)
Patient is discharged from room 4N04 at this time. IV site d/c'd as well as tele. Instructions read to patient and verbally voiced understanding. Left unit via wheelchair in stable condition.

## 2013-06-15 NOTE — Progress Notes (Signed)
Occupational Therapy Note  Order received for wrist cock up splint.  Spoke with Dr. Roseanne Reno.  Recommend off the shelf wrist splint with OT f/u at OP for fabricated splint if indicated.  He agreed.  Spoke with ortho tech who will issue splint.  Jeani Hawking, OTR/L [phone]

## 2013-06-15 NOTE — Progress Notes (Signed)
Agree with SPT.    Lianah Peed, PT [phone]  

## 2013-06-15 NOTE — Discharge Summary (Signed)
Physician Discharge Summary  Guy Mcgee:[id number] DOB: Dec 17, 1955 DOA: 06/13/2013  PCP: Kristian Covey, MD  Admit date: 06/13/2013 Discharge date: 06/15/2013  Time spent: > 35 minutes  Recommendations for Outpatient Follow-up:  Please be sure to follow up with a neurologist in 6 wks if no improvement in condition  Discharge Diagnoses:  Principal Problem:   Right hemiplegia Active Problems:   TESTICULAR HYPOFUNCTION   Alcohol abuse, episodic drinking behavior   Anxiety and depression   Elevated serum creatinine   Discharge Condition: stable  Diet recommendation: heart healthy  Filed Weights   06/14/13 0302  Weight: 100.5 kg (221 lb 9 oz)    History of present illness:  From original HPI:  Guy Mcgee is an 57 y.o. 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   Hospital Course:  Acute radial nerve palsy - Most likely secondary to pressure applied on nerve after patient drank excessively combined with sleeping pill. Neurology evaluated while in-house and recommended the following: 1) PT/OT  2) Will need wrist brace (spoken to OT Brynn)  3) Follow up out patient neurology for possible EMG/NCV in 6 weeks if no improvement  MRI of brain/CT of head: Was negative for acute stroke  Procedures:  As listed above including MRA of head  Consultations: Neurology Discharge Exam: Filed Vitals:   06/15/13 1500  BP: 140/84  Pulse: 103  Temp: 98.4 F (36.9 C)  Resp: 20    General: In no acute distress, alert and awake Cardiovascular: RRR, no MRG Respiratory: CTA BL, no wheezes Neurology: Weakness with radial  extension  Discharge Instructions  Discharge Orders   Future Orders Complete By Expires   Diet - low sodium heart healthy  As directed    Discharge instructions  As directed     Comments:     EMG in 6 wks with no improvement in condition.   Increase activity slowly  As directed        Medication List         Testosterone 12.5 MG/ACT (1%) Gel  Commonly known as:  ANDROGEL PUMP  Apply 8 sprays topically daily. Apply 4 pumps to each upper arm daily (8 pumps per day)       No Known Allergies    The results of significant diagnostics from this hospitalization (including imaging, microbiology, ancillary and laboratory) are listed below for reference.    Significant Diagnostic Studies: Ct Head Wo Contrast  06/13/2013   CLINICAL DATA:  Right-sided weakness.  EXAM: CT HEAD WITHOUT CONTRAST  TECHNIQUE: Contiguous axial images were obtained from the base of the skull through the vertex without intravenous contrast.  COMPARISON:  None.  FINDINGS: Normal appearing cerebral hemispheres and posterior fossa structures. Normal size and position of the ventricles. No intracranial hemorrhage, mass lesion or CT evidence of acute infarction. Unremarkable bones and included paranasal sinuses.  IMPRESSION: Normal examination.   Electronically Signed   By: Gordan Payment M.D.   On: 06/13/2013 23:35   Mri Brain Without Contrast  06/14/2013   CLINICAL DATA:  Right-sided weakness  EXAM: MRI HEAD WITHOUT CONTRAST  MRA HEAD WITHOUT CONTRAST  TECHNIQUE: Multiplanar, multiecho pulse sequences of the brain and surrounding structures were obtained without intravenous contrast. Angiographic images of the head were obtained using MRA technique without contrast.  COMPARISON:  Head CT 06/13/2013  FINDINGS: MRI HEAD FINDINGS  The  brain has normal appearance on all pulse sequences without evidence of malformation, atrophy, old or acute infarction, mass lesion, hemorrhage, hydrocephalus or extra-axial collection. Incidental cavum septum pellucidum. No pituitary mass. No inflammatory sinus disease. No skull or skullbase lesion. 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 There is hypertrophic  degenerative disease of the C1-2 articulation which effaces the subarachnoid space ventral to the medulla.  MRA HEAD FINDINGS  Both internal carotid arteries are widely patent into the brain. The anterior and middle cerebral vessels are patent without proximal stenosis, aneurysm or vascular malformation. The right internal carotid artery supplies only the right middle cerebral artery territory. The left ICA supplies the left MCA, both anterior cerebral arteries and the left PCA.  The right vertebral artery is widely patent to the basilar. No antegrade flow is seen in a left vertebral artery. This could be congenitally small or absent or could be occluded. There is slight retrograde flow on the left probably supplying the anterior inferior cerebellar artery. No basilar stenosis. Posterior circulation branch vessels are intact, with the left PCA receiving most of its supply from the anterior circulation as noted above.  IMPRESSION: No evidence of old or acute infarction. Normal appearance the brain itself.  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 This is not likely clinically significant.  No anterior circulation pathology. Congenital variations as described above.  No antegrade flow seen in the left vertebral artery. Most likely, this is occluded. I cannot exclude the possibility that this is a congenital variation with an aplastic or markedly hypoplastic left vertebral artery.   Electronically Signed   By: Paulina Fusi M.D.   On: 06/14/2013 09:11   Mr Maxine Glenn Head/brain Wo Cm  06/14/2013   CLINICAL DATA:  Right-sided weakness  EXAM: MRI HEAD WITHOUT CONTRAST  MRA HEAD WITHOUT CONTRAST  TECHNIQUE: Multiplanar, multiecho pulse sequences of the brain and surrounding structures were obtained without intravenous contrast. Angiographic images of the head were obtained using MRA technique without contrast.  COMPARISON:  Head CT  06/13/2013  FINDINGS: MRI HEAD FINDINGS  The brain has normal appearance on all pulse sequences without evidence of malformation, atrophy, old or acute infarction, mass lesion, hemorrhage, hydrocephalus or extra-axial collection. Incidental cavum septum pellucidum. No pituitary mass. No inflammatory sinus disease. No skull or skullbase lesion. 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  MRA HEAD FINDINGS  Both internal carotid arteries are widely patent into the brain. The anterior and middle cerebral vessels are patent without proximal stenosis, aneurysm or vascular malformation. The right internal carotid artery supplies only the right middle cerebral artery territory. The left ICA supplies the left MCA, both anterior cerebral arteries and the left PCA.  The right vertebral artery is widely patent to the basilar. No antegrade flow is seen in a left vertebral artery. This could be congenitally small or absent or could be occluded. There is slight retrograde flow on the left probably supplying the anterior inferior cerebellar artery. No basilar stenosis. Posterior circulation branch vessels are intact, with the left PCA receiving most of its supply from the anterior circulation as noted above.  IMPRESSION: No evidence of old or acute infarction. Normal appearance the brain itself.  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 This is not likely clinically significant.  No  anterior circulation pathology. Congenital variations as described above.  No antegrade flow seen in the left vertebral artery. Most likely, this is occluded. I cannot exclude the possibility that this is a congenital variation with an aplastic or markedly hypoplastic left vertebral artery.   Electronically Signed   By: Paulina Fusi M.D.   On: 06/14/2013 09:11    Microbiology: Recent Results (from the past 240 hour(s))  URINE CULTURE     Status: None   Collection Time    06/14/13  5:52 AM      Result Value Range Status   Specimen Description URINE, CLEAN CATCH   Final   Special Requests CX ADDED AT 0628 ON [id number]   Final   Culture  Setup Time     Final   Value: 06/14/2013 14:20     Performed at [facility]   Culture     Final   Value: NO GROWTH     Performed at [facility]   Report Status 06/15/2013 FINAL   Final     Labs: Basic Metabolic Panel:  Recent Labs Lab 06/13/13 2301 06/14/13 0415  NA 133*  --   K 4.0  --   CL 99  --   CO2 21  --   GLUCOSE 125*  --   BUN 33*  --   CREATININE 2.04* 2.29*  CALCIUM 8.8  --    Liver Function Tests:  Recent Labs Lab 06/13/13 2301  AST 340*  ALT 203*  ALKPHOS 70  BILITOT 0.5  PROT 7.2  ALBUMIN 4.0   No results found for this basename: LIPASE, AMYLASE,  in the last 168 hours No results found for this basename: AMMONIA,  in the last 168 hours CBC:  Recent Labs Lab 06/13/13 2301 06/14/13 0415  WBC 16.6* 13.1*  NEUTROABS 13.4*  --   HGB 17.3* 15.7  HCT 50.5 46.0  MCV 90.3 90.7  PLT 227 201   Cardiac Enzymes:  Recent Labs Lab 06/13/13 2301  TROPONINI <0.30   BNP: BNP (last 3 results) No results found for this basename: PROBNP,  in the last 8760 hours CBG:  Recent Labs Lab 06/14/13 2151 06/15/13 0504 06/15/13 0604 06/15/13 1126 06/15/13 1643  GLUCAP 82 91 85 89 81       Signed:  Jabes Primo  [facility] 06/15/2013, 6:05 PM

## 2013-06-16 NOTE — Progress Notes (Signed)
UR complete.  Ridhi Hoffert RN, MSN 

## 2013-07-19 ENCOUNTER — Encounter: Payer: Self-pay | Admitting: Family Medicine

## 2013-07-19 ENCOUNTER — Ambulatory Visit (INDEPENDENT_AMBULATORY_CARE_PROVIDER_SITE_OTHER): Payer: 59 | Admitting: Family Medicine

## 2013-07-19 ENCOUNTER — Telehealth: Payer: Self-pay

## 2013-07-19 VITALS — BP 132/82 | HR 93 | Temp 98.5°F | Wt 216.0 lb

## 2013-07-19 DIAGNOSIS — G5630 Lesion of radial nerve, unspecified upper limb: Secondary | ICD-10-CM

## 2013-07-19 DIAGNOSIS — F329 Major depressive disorder, single episode, unspecified: Secondary | ICD-10-CM

## 2013-07-19 DIAGNOSIS — G5631 Lesion of radial nerve, right upper limb: Secondary | ICD-10-CM

## 2013-07-19 DIAGNOSIS — E291 Testicular hypofunction: Secondary | ICD-10-CM

## 2013-07-19 MED ORDER — SERTRALINE HCL 50 MG PO TABS: 50.0000 mg | ORAL_TABLET | Freq: Every day | ORAL | Status: DC

## 2013-07-19 MED ORDER — TESTOSTERONE 12.5 MG/ACT (1%) TD GEL: 8.0000 | Freq: Every day | TRANSDERMAL | Status: DC

## 2013-07-19 NOTE — Telephone Encounter (Signed)
Change to Androgel 1.62% and ONE spray per arm once daily.  We will plan repeat level in 3 weeks at follow up.

## 2013-07-19 NOTE — Patient Instructions (Signed)
Radial Nerve Palsy °Wrist drop is also known as radial nerve palsy. It is a condition in which you can not extend your wrist. This means if you are standing with your elbow bent at a right angle and with the top of your hand pointed at the ceiling, you can not hold your hand up. It falls toward the floor.  °This action of extending your wrist is caused by the muscles in the back of your arm. These muscles are controlled by the radial nerve. This means that anything affecting the radial nerve so it can not tell the muscles how to work will cause wrist drop. This is medically called radial nerve palsy. Also the radial nerve is a motor and sensory nerve so anything affecting it causes problems with movement and feeling. °CAUSES  °Some more common causes of wrist drop are: °· A break (fracture) of the large bone in the arm between your shoulder and your elbow (humerus). This is because the radial nerve winds around the humerus. °· Improper use of crutches causes this because the radial nerve runs through the armpit (axilla). Crutches which are too long can put pressure on the nerve. This is sometimes called crutch palsy. °· Falling asleep with your arm over a chair and supported on the back is a common cause. This is sometimes called Saturday Night Syndrome. °· Wrist drop can be associated with lead poisoning because of the effect of lead on the radial nerve. °SYMPTOMS  °The wrist drop is an obvious problem, but there may also be numbness in the back of the arm, forearm or hand which provides feeling in these areas by the radial nerve. There can be difficulty straightening out the elbow in addition to the wrist. There may be numbness, tingling, pain, burning sensations or other abnormal feelings. Symptoms depend entirely on where the radial nerve is injured. °DIAGNOSIS  °· Wrist drop is obvious just by looking at it. Your caregiver may make the diagnosis by taking your history and doing a couple tests. °· One test which  may be done is a nerve conduction study. This test shows if the radial nerve is conducting signals well. If not, it can determine where the nerve problem is. °· Sometimes X-ray studies are done. Your caregiver will determine if further testing needs to be done. °TREATMENT  °· Usually if the problem is found to be pressure on the nerve, simply removing the pressure will allow the nerve to go back to normal in a few weeks to a few months. Other treatments will depend upon the cause found. °· Only take over-the-counter or prescription medicines for pain, discomfort, or fever as directed by your caregiver. °· Sometimes seizure medications are used. °· Steroids are sometimes given to decrease swelling if it is thought to be a possible cause. °Document Released: 03/26/2006 Document Revised: 10/12/2011 Document Reviewed: 05/06/2006 °[employer]® Patient Information ©2014 [employer]. ° °

## 2013-07-19 NOTE — Progress Notes (Signed)
   Subjective:    Patient ID: Guy Mcgee, male    DOB: Jun 25, 1956, 57 y.o.   MRN: [id number]  HPI Here for followup regarding several items as follows:  Right wrist drop (radial nerve palsy). Onset about one month ago. He took a couple of sleeping pills and drank excessive alcohol and apparently was unconscious for some time. Woke up with right wrist drop. Went to the emergency department. Was placed in wrist brace. He has not had any physical therapy. He's had some recovery over the past month but still substantial weakness with extension of the right wrist. No loss of grip strength.  Separation from wife several months ago. He's had ongoing issues with depression and has been in counseling. His counselor has suggested looking at use of antidepressants. He's had no active suicidal ideation. Difficulty sleeping. He had one episode of excessive alcohol use but none since then. No prior history of drug misuse.  He has history of low testosterone has not been on his testosterone replacement recently.  Past Medical History  Diagnosis Date  . Testosterone deficiency    Past Surgical History  Procedure Laterality Date  . Inguinal herniorrhapy      reports that he has never smoked. He does not have any smokeless tobacco history on file. He reports that he drinks alcohol. He reports that he does not use illicit drugs. family history includes Cancer in his other; Diabetes in his other; Hypertension in his other. No Known Allergies    Review of Systems  Constitutional: Positive for appetite change. Negative for fever and chills.  Respiratory: Negative for shortness of breath.   Cardiovascular: Negative for chest pain.  Neurological: Positive for weakness.  Psychiatric/Behavioral: Positive for dysphoric mood. Negative for agitation.       Objective:   Physical Exam  Constitutional: He is oriented to person, place, and time. He appears well-developed and well-nourished.  Neck: Neck  supple. No thyromegaly present.  Cardiovascular: Normal rate and regular rhythm.   Pulmonary/Chest: Effort normal and breath sounds normal. No respiratory distress. He has no wheezes. He has no rales.  Musculoskeletal: He exhibits no edema.  Patient has full range of motion right wrist but definite  weakness with extension  Neurological: He is alert and oriented to person, place, and time.  Deep tendon reflexes symmetric upper extremities. Weakness with right wrist extension but not flexion. Grip strengths are 2+ bilaterally          Assessment & Plan:  #1 right radial nerve palsy. Set up physical therapy for strengthening. He has had some slow recovery over the past month. Leave off wrist splint as much as possible. #2 Maj. depressive episode, single episode. Patient already in counseling. No suicidal ideation.  Start sertraline 50 mg daily. Reassess 3 weeks #3 hypogonadism. Refilled testosterone.

## 2013-07-19 NOTE — Progress Notes (Signed)
Pre visit review using our clinic review tool, if applicable. No additional management support is needed unless otherwise documented below in the visit note. 

## 2013-07-19 NOTE — Telephone Encounter (Signed)
Testosterone 12.5mg /act (1%) gel Apply 8 sprays topically daily. Apply 4 pumps to each upper arm daily. (8 pumps per day) 300g 5 refills  No longer do 12.5 now its 1.25% is it okay to change. Keep the same directions or change  [facility]

## 2013-07-20 MED ORDER — TESTOSTERONE 20.25 MG/ACT (1.62%) TD GEL: TRANSDERMAL | Status: DC

## 2013-07-20 NOTE — Telephone Encounter (Signed)
Faxed RX to pharmacy.  

## 2013-08-01 ENCOUNTER — Ambulatory Visit: Payer: 59 | Attending: Family Medicine

## 2013-08-01 DIAGNOSIS — M6281 Muscle weakness (generalized): Secondary | ICD-10-CM | POA: Insufficient documentation

## 2013-08-01 DIAGNOSIS — R5381 Other malaise: Secondary | ICD-10-CM | POA: Insufficient documentation

## 2013-08-01 DIAGNOSIS — IMO0001 Reserved for inherently not codable concepts without codable children: Secondary | ICD-10-CM | POA: Insufficient documentation

## 2013-08-07 ENCOUNTER — Ambulatory Visit: Payer: 59 | Attending: Family Medicine

## 2013-08-07 DIAGNOSIS — M6281 Muscle weakness (generalized): Secondary | ICD-10-CM | POA: Insufficient documentation

## 2013-08-07 DIAGNOSIS — IMO0001 Reserved for inherently not codable concepts without codable children: Secondary | ICD-10-CM | POA: Insufficient documentation

## 2013-08-07 DIAGNOSIS — R5381 Other malaise: Secondary | ICD-10-CM | POA: Insufficient documentation

## 2013-08-10 ENCOUNTER — Ambulatory Visit: Payer: 59 | Admitting: Physical Therapy

## 2013-08-15 ENCOUNTER — Ambulatory Visit: Payer: 59 | Admitting: Physical Therapy

## 2013-08-17 ENCOUNTER — Ambulatory Visit: Payer: 59 | Admitting: Physical Therapy

## 2013-08-21 ENCOUNTER — Ambulatory Visit: Payer: 59

## 2013-08-24 ENCOUNTER — Encounter: Payer: 59 | Admitting: Physical Therapy

## 2013-08-25 ENCOUNTER — Ambulatory Visit: Payer: 59 | Admitting: Physical Therapy

## 2013-11-02 ENCOUNTER — Telehealth: Payer: Self-pay | Admitting: Family Medicine

## 2013-11-02 MED ORDER — SERTRALINE HCL 100 MG PO TABS: 100.0000 mg | ORAL_TABLET | Freq: Every day | ORAL | Status: DC

## 2013-11-02 NOTE — Telephone Encounter (Signed)
Pt is calling to let md know the zoloft is not working. Pt would like to tried different med. [facility]

## 2013-11-02 NOTE — Telephone Encounter (Signed)
First step would be to increase Zoloft from 50 mg to 100 mg once daily. Let's plan office followup in 2 weeks reassess after making that increase

## 2013-11-02 NOTE — Telephone Encounter (Signed)
Last visit  07/19/13 Last refill 07/19/13 #30 3 refill

## 2013-11-02 NOTE — Telephone Encounter (Signed)
Pt informed and RX changed and sent to pharmacy

## 2013-11-16 ENCOUNTER — Encounter: Payer: Self-pay | Admitting: Family Medicine

## 2013-11-16 ENCOUNTER — Ambulatory Visit (INDEPENDENT_AMBULATORY_CARE_PROVIDER_SITE_OTHER): Payer: 59 | Admitting: Family Medicine

## 2013-11-16 VITALS — BP 134/84 | HR 105 | Wt 222.0 lb

## 2013-11-16 DIAGNOSIS — F329 Major depressive disorder, single episode, unspecified: Secondary | ICD-10-CM

## 2013-11-16 DIAGNOSIS — F341 Dysthymic disorder: Secondary | ICD-10-CM

## 2013-11-16 DIAGNOSIS — F419 Anxiety disorder, unspecified: Principal | ICD-10-CM

## 2013-11-16 DIAGNOSIS — F32A Depression, unspecified: Secondary | ICD-10-CM

## 2013-11-16 DIAGNOSIS — E291 Testicular hypofunction: Secondary | ICD-10-CM

## 2013-11-16 LAB — PSA: PSA: 0.86 ng/mL (ref 0.10–4.00)

## 2013-11-16 NOTE — Progress Notes (Signed)
   Subjective:    Patient ID: Guy Mcgee, male    DOB: 1955/09/07, 58 y.o.   MRN: [id number]  HPI Followup regarding anxiety and depression. Refer to recent notes. We have placed him on sertraline several weeks ago. He noticed some initial improvement was having significant anxiety and what sounds like some social anxiety symptoms couple weeks ago when he called by phone. We titrated his sertraline 100 mg daily and has seen great improvement. Still has some sleep difficulties. He's had some marital discord is still separated but he and his wife are talking and he is in regular counseling. He's not had any suicidal ideation. No further binge drinking. Radial nerve palsy from last visit has resolved.  He is on testosterone replacement. He had CBC back in November which was unremarkable. He has not had PSA but almost most 2 years. No obstructive urinary symptoms.  Past Medical History  Diagnosis Date  . Testosterone deficiency    Past Surgical History  Procedure Laterality Date  . Inguinal herniorrhapy      reports that he has never smoked. He does not have any smokeless tobacco history on file. He reports that he drinks alcohol. He reports that he does not use illicit drugs. family history includes Cancer in his other; Diabetes in his other; Hypertension in his other. No Known Allergies    Review of Systems  Constitutional: Negative for fever, chills and fatigue.  Eyes: Negative for visual disturbance.  Respiratory: Negative for cough, chest tightness and shortness of breath.   Cardiovascular: Negative for chest pain, palpitations and leg swelling.  Endocrine: Negative for polydipsia and polyuria.  Neurological: Negative for dizziness, syncope, weakness, light-headedness and headaches.       Objective:   Physical Exam  Constitutional: He appears well-developed and well-nourished.  Neck: Neck supple. No thyromegaly present.  Cardiovascular: Normal rate and regular rhythm.  Exam  reveals no gallop.   No murmur heard. Pulmonary/Chest: Effort normal and breath sounds normal. No respiratory distress. He has no wheezes. He has no rales.  Neurological: He is alert.  Psychiatric: He has a normal mood and affect. His behavior is normal. Thought content normal.          Assessment & Plan:  #1 adjustment disorder with mixed depression and anxiety improved on increased dose of sertraline. He is strongly encouraged to continue regular counseling. Reassess 6 months #2 hypogonadism. Check PSA. Recent CBC normal.

## 2013-11-16 NOTE — Progress Notes (Signed)
Pre visit review using our clinic review tool, if applicable. No additional management support is needed unless otherwise documented below in the visit note. 

## 2014-01-22 ENCOUNTER — Other Ambulatory Visit: Payer: Self-pay | Admitting: Family Medicine

## 2014-01-22 NOTE — Telephone Encounter (Signed)
Last visit 11/16/13 Last refill 07/20/13 75g 5 refills

## 2014-01-22 NOTE — Telephone Encounter (Signed)
Refills OK. 

## 2014-03-01 ENCOUNTER — Other Ambulatory Visit: Payer: Self-pay | Admitting: Family Medicine

## 2014-03-01 NOTE — Telephone Encounter (Signed)
Refill OK

## 2014-03-01 NOTE — Telephone Encounter (Signed)
Last visit 11-16-13 Last refill 01/23/14 75g 0 refills

## 2014-03-16 ENCOUNTER — Encounter: Payer: Self-pay | Admitting: Family Medicine

## 2014-03-16 ENCOUNTER — Ambulatory Visit (INDEPENDENT_AMBULATORY_CARE_PROVIDER_SITE_OTHER): Payer: 59 | Admitting: Family Medicine

## 2014-03-16 VITALS — BP 134/88 | HR 100 | Wt 222.0 lb

## 2014-03-16 DIAGNOSIS — E291 Testicular hypofunction: Secondary | ICD-10-CM

## 2014-03-16 MED ORDER — TESTOSTERONE 20.25 MG/ACT (1.62%) TD GEL: 1.0000 | Freq: Every day | TRANSDERMAL | Status: DC

## 2014-03-16 NOTE — Progress Notes (Signed)
Pre visit review using our clinic review tool, if applicable. No additional management support is needed unless otherwise documented below in the visit note. 

## 2014-03-16 NOTE — Progress Notes (Signed)
   Subjective:    Patient ID: Guy Mcgee, male    DOB: 12/16/1955, 58 y.o.   MRN: [id number]  HPI  Patient seen for medical followup. History of hypogonadism. Because of insurance, change of medication recently. He is now taking AndroGel. Symptomatically stable. PSA 0.86 last April. He had CBC last November with hemoglobin 15. Overall feels well.  Past Medical History  Diagnosis Date  . Testosterone deficiency    Past Surgical History  Procedure Laterality Date  . Inguinal herniorrhapy      reports that he has never smoked. He does not have any smokeless tobacco history on file. He reports that he drinks alcohol. He reports that he does not use illicit drugs. family history includes Cancer in his other; Diabetes in his other; Hypertension in his other. No Known Allergies    Review of Systems  Constitutional: Negative for appetite change and unexpected weight change.  Respiratory: Negative for cough and shortness of breath.   Cardiovascular: Negative for chest pain, palpitations and leg swelling.       Objective:   Physical Exam  Neck: Neck supple. No thyromegaly present.  Cardiovascular: Normal rate and regular rhythm.  Exam reveals no gallop.   No murmur heard. Pulmonary/Chest: Effort normal and breath sounds normal. No respiratory distress. He has no wheezes. He has no rales.  Lymphadenopathy:    He has no cervical adenopathy.          Assessment & Plan:  Hypogonadism. He's been out of medication for the past week. Get back on AndroGel one spray per per arm once daily. In 3 months repeat CBC and total testosterone level. Refills given for 6 months

## 2014-04-05 ENCOUNTER — Encounter: Payer: Self-pay | Admitting: Internal Medicine

## 2014-05-02 ENCOUNTER — Telehealth: Payer: Self-pay | Admitting: Family Medicine

## 2014-05-02 NOTE — Telephone Encounter (Signed)
[facility]  Advanced Heart Failure and Transplant  Heart Transplant Clinic  Follow-up Visit    Primary Care Physician: Brodsky, Mark E  Referring Provider: Brett Justin Berman  Date of Transplant: 09/22/2019  Organ(s) Transplanted: heart  Indication for transplant: Dilated Myopathy: Idiopathic  PHS increased risk donor: Yes    ID. 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    Interval History:    The patient was last seen on 11/17/21. At that time issues with pain after urologic procedure.    He continues to deal with pain issue largely from prostate surgery. He still has some bleeding and some tissue come out. He tried different strategies and nothing helped. This is really impacting quality of life. He gets tired and frustrated and does not want to take it out on his family.    ROS:  A complete ROS was performed and is negative except as documented in the HPI.      Allergies:  Patient is allergic to cats [other] and dogs [other].    Past Medical History:   Diagnosis Date    Asthma     Atrial fibrillation (CMS-HCC)     Chronic HFrEF (heart failure with reduced ejection fraction) (CMS-HCC)     GERD (gastroesophageal reflux disease)     HTN (hypertension)     Insomnia     Nephrolithiasis     Sinusitis      Patient Active Problem List   Diagnosis    COPD (chronic obstructive pulmonary disease) (CMS-HCC)    Heart transplant, orthotopic, 09/22/2019    Pericardial effusion    Hypertension    Chronic back pain    At risk for infection transmitted from donor    Acute hepatitis C virus infection    Heart transplanted (CMS-HCC)    Acute UTI    Umbilical hernia without obstruction and without gangrene    COVID-19 virus detected    Acute medial meniscus tear of left knee, sequela    Localized osteoarthritis of left knee     Past Surgical History:   Procedure Laterality Date    CARDIAC DEFIBRILLATOR PLACEMENT       PB ANESTH,SHOULDER JOINT,NOS Right      Family History   Problem Relation Name Age of Onset    Hypertension Other      Other Maternal Grandmother          kidney disease needing HD     Social History     Socioeconomic History    Marital status: Single     Spouse name: Not on file    Number of children: Not on file    Years of education: Not on file    Highest education level: Not on file   Occupational History    Not on file   Tobacco Use    Smoking status: Never    Smokeless tobacco: Never    Tobacco comments:     from friends and relatives    Substance and Sexual Activity    Alcohol use: Not Currently     Comment: Prior heavier use, but completely quit in 2016    Drug use: Yes     Comment: eats edible marijuana for pain and insomnia     Sexual activity: Not on file   Other Topics Concern    Not on file   Social History Narrative      Born in [place], also lived in [place], [place], [place], no travel, worked as a carpenter, occasional cedar, no birds, no hot tubs, worked in construction + possible asbestos exposure      Social Determinants of Health     Financial Resource Strain: Not on file   Food Insecurity: Not on file   Transportation Needs: Not on file   Physical Activity: Not on file   Stress: Not on file   Social Connections: Not on file   Intimate Partner Violence: Not on file   Housing Stability: Not on file     Current Outpatient Medications   Medication Sig    albuterol 108 (90 Base) MCG/ACT inhaler Inhale 2 puffs by mouth every 4 hours as needed for Wheezing or Shortness of Breath.    aspirin 81 MG EC tablet Take 1 tablet (81 mg) by mouth daily.    baclofen (LIORESAL) 10 MG tablet Take 2 tablets (20 mg) by mouth nightly.    Blood Glucose Monitoring Suppl (TRUE METRIX METER) w/Device KIT Use as directed    budesonide-formoterol (SYMBICORT) 160-4.5 MCG/ACT inhaler Inhale 2 puffs by mouth every 12 hours.    bumetanide (BUMEX) 1 MG tablet Take 1 tablet (1 mg) by mouth daily as needed (fluid/weight  gain). Do not take unless instructed by Transplant team.    Calcium Carb-Cholecalciferol 600-10 MG-MCG TABS Take 1 tablet by mouth 2 times daily.    Cetirizine HCl (ZERVIATE) 0.24 % SOLN Place 1 drop into both eyes 2 times daily.    clindamycin (CLEOCIN T) 1 % solution Apply 1 Application. topically 2 times daily. Apply to the red bumps on your face up to two times a day.    controlled substance agreement controlled substance agreement    diclofenac (VOLTAREN) 1 % gel Apply 2 g topically 4 times daily.    docusate sodium (COLACE) 100 MG capsule Take 1 capsule (100 mg) by mouth 2 times daily.    DULoxetine (CYMBALTA) 30 MG CR capsule Take 1 capsule (30 mg) by mouth daily.    famotidine (PEPCID) 20 MG tablet Take 1 tablet (20 mg) by mouth 2 times daily.    fluticasone propionate (FLONASE) 50 MCG/ACT nasal spray Spray 1 spray into each nostril 2 times daily.    gabapentin (NEURONTIN) 300 MG capsule Take 1 capsule (300 mg) by mouth every morning AND 1 capsule (300 mg) daily AND 2 capsules (600 mg) every evening.    hydroCHLOROthiazide (HYDRODIURIL) 25 MG tablet Take 1 tablet (25 mg) by mouth daily.    ketoconazole (NIZORAL) 2 % shampoo Use shampoo daily for dandruff    lidocaine (LIDOCAINE PAIN RELIEF) 4 % patch Apply 1 patch topically every 24 hours. Leave patch on for 12 hours, then remove for 12 hours.    lisinopril (PRINIVIL, ZESTRIL) 10 MG tablet Take 2 tablets (20 mg) by mouth daily.    magnesium oxide (MAG-OX) 400 MG tablet Take 1 tablet by mouth daily    melatonin (GNP MELATONIN MAXIMUM STRENGTH) 5 MG tablet Take 2 tablets (10 mg) by mouth at bedtime.    Multiple Vitamin (MULTIVITAMIN) TABS tablet Take 1 tablet by mouth daily.    naloxone (KLOXXADO) 8 mg/0.1 mL nasal spray Call 911! Tilt head and spray intranasally into one nostril as needed for respiratory depression. If patient does not respond or responds and then relapses, repeat using a new nasal spray every 3 minutes until emergency medical assistance  arrives.    NEEDLE, DISP, 25 G 25G X   1" MISC Use to inject testosterone    NIFEdipine (ADALAT CC) 30 MG Controlled-Release tablet Take 1 tablet (30 mg) by mouth nightly.    ondansetron (ZOFRAN) 8 MG tablet Take 1 tablet (8 mg) by mouth every 8 hours as needed for Nausea/Vomiting.    oxyCODONE (ROXICODONE) 10 MG tablet Take 1 tab every 4 hours as needed for moderate pain and 2 tabs every 4 hours as needed for severe pain. Max 10 tabs per day, 28 day supply    phenazopyridine (PYRIDIUM) 100 MG tablet Take 1 tablet (100 mg) by mouth 3 times daily.    polyethylene glycol (GLYCOLAX) 17 GM/SCOOP powder Mix 17 grams in 4-8 oz of liquide and drink by mouth daily as needed (Constipation).    pravastatin (PRAVACHOL) 40 MG tablet Take 1 tablet (40 mg) by mouth every evening.    senna (SENOKOT) 8.6 MG tablet Take 1 tablet (8.6 mg) by mouth daily.    sirolimus (RAPAMUNE) 1 MG tablet Take 2 tablets (2 mg) by mouth every morning.    SYRINGE-NEEDLE, DISP, 3 ML (B-D 3CC LUER-LOK SYR 25GX1") 25G X 1" 3 ML MISC Use as directed to inject testosterone    SYRINGE-NEEDLE, DISP, 3 ML 18G X 1-1/2" 3 ML MISC Use to draw up testosterone    tacrolimus (ENVARSUS XR) 1 MG tablet STOP TAKING since 04/15/22 - remaining on chart for dose adjustments, titratable med.    tacrolimus (ENVARSUS XR) 4 MG tablet Take 1 tablet (4 mg) by mouth every morning.    tamsulosin (FLOMAX) 0.4 MG capsule Take 1 capsule (0.4 mg) by mouth daily.    tamsulosin (FLOMAX) 0.4 MG capsule Take 1 capsule (0.4 mg) by mouth daily.    testosterone cypionate (DEPO-TESTOSTERONE) 200 MG/ML SOLN Inject 1 ml into the muscle every 14 days    traZODone (DESYREL) 50 MG tablet Take 1 tablet (50 mg) by mouth nightly.     Current Facility-Administered Medications   Medication    diphenhydrAMINE (BENADRYL) injection 50 mg    diphenhydrAMINE (BENADRYL) tablet 50 mg     Immunization History   Administered Date(s) Administered    COVID-19 (Moderna) Low Dose Red Cap >= 18 Years 09/13/2020     COVID-19 (Moderna) Red Cap >= 12 Years 10/21/2019, 11/20/2019, 03/20/2020    Hep-A/Hep-B; Twinrix, Adult 10/14/2020    Influenza Vaccine (High Dose) Quadrivalent >=65 Years 06/05/2020    Influenza Vaccine (Unspecified) 04/03/2017    Influenza Vaccine >=6 Months 06/16/2010, 06/16/2011, 08/11/2012, 05/09/2014, 04/22/2018, 05/08/2019    Pneumococcal 13 Vaccine (PREVNAR-13) 06/05/2020    Pneumococcal 23 Vaccine (PNEUMOVAX-23) 06/16/2013, 10/14/2020    Tdap 08/04/2011   Deferred Date(s) Deferred    Pneumococcal 23 Vaccine (PNEUMOVAX-23) 10/05/2019     Physical Exam:  BP 102/69 (BP Location: Right arm, BP Patient Position: Sitting, BP cuff size: Large)   Pulse 98   Temp 98.5 F (36.9 C) (Temporal)   Resp 16   Ht 5' 10" (1.778 m)   Wt 96.2 kg (212 lb)   SpO2 97%   BMI 30.42 kg/m      General Appearance: ***alert, no distress, pleasant affect, cooperative.  Heart:  JVD ***, PMI ***, normal rate and regular rhythm, no murmurs, clicks, or gallops. ***  Lungs: ***clear to auscultation and percussion. No rales, rhonchi, or wheezes noted. No chest deformities noted.  Abdomen: ***BS normal.  Abdomen soft, non-tender.  No masses or organomegaly.  Extremities:  ***no cyanosis, clubbing, or edema. Has 2+ peripheral pulses.        Lab Data:  Lab Results   Component Value Date    BUN 26 (H) 04/15/2022    CREAT 1.98 (H) 04/15/2022    CL 99 04/15/2022    NA 140 04/15/2022    K 4.4 04/15/2022    CA 9.2 04/15/2022    TBILI 0.47 04/15/2022    ALB 4.1 04/15/2022    TP 7.1 04/15/2022    AST 22 04/15/2022    ALK 76 04/15/2022    BICARB 29 04/15/2022    ALT 25 04/15/2022    GLU 126 (H) 04/15/2022     Lab Results   Component Value Date    WBC 7.9 04/15/2022    RBC 5.50 04/15/2022    HGB 15.2 04/15/2022    HCT 46.5 04/15/2022    MCV 84.5 04/15/2022    MCHC 32.7 04/15/2022    RDW 12.3 04/15/2022    PLT 162 04/15/2022    MPV 11.6 04/15/2022     Lab Results   Component Value Date    A1C 5.7 09/12/2021     Lab Results   Component Value Date     TSH 1.63 09/12/2021     Lab Results   Component Value Date    CHOL 105 09/12/2021    HDL 38 09/12/2021    LDLCALC 43 09/12/2021    TRIG 121 09/12/2021     Lab Results   Component Value Date    SIROT 11.5 04/15/2022     Lab Results   Component Value Date    FKTR 6.5 04/15/2022     No results found for: CSATR  Lab Results   Component Value Date    CMVPL Not Detected 07/04/2021     Lab Results   Component Value Date    DSA ABSENT 04/15/2022       Prior Cardiovascular Studies:   Lab Results   Component Value Date    LV Ejection Fraction 59 10/09/2021          Echo 10/09/21  Summary:   1. The left ventricular size is normal. The left ventricular systolic function is normal.   2. No left ventricular hypertrophy.   3. Normal pattern of left ventricular diastolic filling.   4. EF=59%.   5. Compared to prior study EF now 59%, was 69% 11/01/20.     LHC/IVUS 10/07/21  CONCLUSION:                                                                   1. 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                              2. No angiographic evidence of coronary artery disease.                      3. Intimal thickness noted in LAD/LM up to 0.5 mm (Stable to slightly       worse compare to 2022).                                                         4. Non significant FFR at apical LAD.                                        5. Left ventricular end diastolic pressure appears normal.        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    Assessment/Plan:  # Hematuria  # Dysuria  # Chronic pain  Assessment: We had a long frank discussion about patient's chronic pain issues and the heart transplant team's role in this. I discussed with him that when I initially agreed to cover his chronic opiate prescription, this was the assumption that he would  have a provider versed in chronic pain after 3-4 months, but we are at 6 months and has unable to find one. Additionally, I had not put him on a pain contract at that time, but he recently used more opiates without asking and I informed him this was not appropriate, but because he had not established guidelines I was not going to stop at this time. However, going forward until he can establish with a pain physician, we will set up a pain contract and he will need to follow through like a usual pain clinic with us with goal of provider in 3-4 months or I may start tapering. I will augment adjuvant agents additionally for now and we can continue to work on this.  Plan:  -pain contract signed  -urine tox monthly  -clinic follow up month  -oxycodone 10 mg tablets PO, 1 tab every 4 hours moderate pain, 2 tabs every 4 hours for severe pain, no more than 10 tablets a day, total 280 per 28 days.   -diclofenac cream for joint pain  -lidocaine patch for back pain  -trial of pyridium  -increase gaba at night  -siro change as below  -cymbalta as below    # End-stage heart failure s/p orthotopic heart transplant  # Chronic Immunosuppression/Immunomodulation  Assessment: While we thought continuing sirolimus would help prevent recurrent scar tissue from prostate procedure, it may be exacerbating factors now with delayed wound healing. Will try mmf for 1 month.  Plan:   - continue envarsus 6 mg daily, goal trough 4-8  - HOLD sirolimus 3 mg daily, goal trough 4-8 for at least 1 month  - start mmf 1000 mg bid for one month to allow healing  - Continue to monitor for renal toxicities, infection risk and malignancy risk  - continue pravastatin 40 mg daily  - continue aspirin 81 mg daily    # Hypertension  Assessment: controlled  Plan:  -continue lisinopril 20 mg daily  -resume hctz  -nifedipine 30 mg daily    # Dyslipidemia  -continue pravastatin 40 mg daily    # Depression  Assessment: improved mood  Plan:  -increase cymbalta to 120  mg daily     RTC in 1 month       Nicholas W Wettersten, MD  Advanced Heart Failure, Mechanical Circulatory Support, Transplant  Pgr: [phone]

## 2014-05-02 NOTE — Telephone Encounter (Signed)
Pt called to say that he need rx for 1 pump per arm per day Testosterone (ANDROGEL PUMP) 20.25 MG/ACT (1.62%) GEL  Pharmacy ; [facility] [address]

## 2014-06-18 ENCOUNTER — Ambulatory Visit: Payer: 59 | Admitting: Family Medicine

## 2014-11-02 ENCOUNTER — Other Ambulatory Visit: Payer: Self-pay | Admitting: Family Medicine

## 2014-11-02 NOTE — Telephone Encounter (Signed)
Rx is currently on patient med list. Is it okay to refill

## 2014-11-04 NOTE — Telephone Encounter (Signed)
Refill for 6 months. 

## 2014-11-06 ENCOUNTER — Encounter: Payer: Self-pay | Admitting: Internal Medicine

## 2014-11-23 ENCOUNTER — Ambulatory Visit (INDEPENDENT_AMBULATORY_CARE_PROVIDER_SITE_OTHER): Payer: 59 | Admitting: Family Medicine

## 2014-11-23 ENCOUNTER — Encounter: Payer: Self-pay | Admitting: Family Medicine

## 2014-11-23 VITALS — BP 132/84 | HR 110 | Temp 98.6°F | Wt 223.0 lb

## 2014-11-23 DIAGNOSIS — F4321 Adjustment disorder with depressed mood: Secondary | ICD-10-CM | POA: Diagnosis not present

## 2014-11-23 MED ORDER — ARIPIPRAZOLE 2 MG PO TABS: 2.0000 mg | ORAL_TABLET | Freq: Every day | ORAL | Status: DC

## 2014-11-23 NOTE — Patient Instructions (Signed)
may titrate up to two tablets at night after 3-4 days if no improvement with one.  Insomnia Insomnia is frequent trouble falling and/or staying asleep. Insomnia can be a long term problem or a short term problem. Both are common. Insomnia can be a short term problem when the wakefulness is related to a certain stress or worry. Long term insomnia is often related to ongoing stress during waking hours and/or poor sleeping habits. Overtime, sleep deprivation itself can make the problem worse. Every little thing feels more severe because you are overtired and your ability to cope is decreased. CAUSES   Stress, anxiety, and depression.  Poor sleeping habits.  Distractions such as TV in the bedroom.  Naps close to bedtime.  Engaging in emotionally charged conversations before bed.  Technical reading before sleep.  Alcohol and other sedatives. They may make the problem worse. They can hurt normal sleep patterns and normal dream activity.  Stimulants such as caffeine for several hours prior to bedtime.  Pain syndromes and shortness of breath can cause insomnia.  Exercise late at night.  Changing time zones may cause sleeping problems (jet lag). It is sometimes helpful to have someone observe your sleeping patterns. They should look for periods of not breathing during the night (sleep apnea). They should also look to see how long those periods last. If you live alone or observers are uncertain, you can also be observed at a sleep clinic where your sleep patterns will be professionally monitored. Sleep apnea requires a checkup and treatment. Give your caregivers your medical history. Give your caregivers observations your family has made about your sleep.  SYMPTOMS   Not feeling rested in the morning.  Anxiety and restlessness at bedtime.  Difficulty falling and staying asleep. TREATMENT   Your caregiver may prescribe treatment for an underlying medical disorders. Your caregiver can give  advice or help if you are using alcohol or other drugs for self-medication. Treatment of underlying problems will usually eliminate insomnia problems.  Medications can be prescribed for short time use. They are generally not recommended for lengthy use.  Over-the-counter sleep medicines are not recommended for lengthy use. They can be habit forming.  You can promote easier sleeping by making lifestyle changes such as:  Using relaxation techniques that help with breathing and reduce muscle tension.  Exercising earlier in the day.  Changing your diet and the time of your last meal. No night time snacks.  Establish a regular time to go to bed.  Counseling can help with stressful problems and worry.  Soothing music and white noise may be helpful if there are background noises you cannot remove.  Stop tedious detailed work at least one hour before bedtime. HOME CARE INSTRUCTIONS   Keep a diary. Inform your caregiver about your progress. This includes any medication side effects. See your caregiver regularly. Take note of:  Times when you are asleep.  Times when you are awake during the night.  The quality of your sleep.  How you feel the next day. This information will help your caregiver care for you.  Get out of bed if you are still awake after 15 minutes. Read or do some quiet activity. Keep the lights down. Wait until you feel sleepy and go back to bed.  Keep regular sleeping and waking hours. Avoid naps.  Exercise regularly.  Avoid distractions at bedtime. Distractions include watching television or engaging in any intense or detailed activity like attempting to balance the household checkbook.  Develop a  bedtime ritual. Keep a familiar routine of bathing, brushing your teeth, climbing into bed at the same time each night, listening to soothing music. Routines increase the success of falling to sleep faster.  Use relaxation techniques. This can be using breathing and  muscle tension release routines. It can also include visualizing peaceful scenes. You can also help control troubling or intruding thoughts by keeping your mind occupied with boring or repetitive thoughts like the old concept of counting sheep. You can make it more creative like imagining planting one beautiful flower after another in your backyard garden.  During your day, work to eliminate stress. When this is not possible use some of the previous suggestions to help reduce the anxiety that accompanies stressful situations. MAKE SURE YOU:   Understand these instructions.  Will watch your condition.  Will get help right away if you are not doing well or get worse. Document Released: 07/17/2000 Document Revised: 10/12/2011 Document Reviewed: 08/17/2007 [facility] Patient Information 2015 [place], [place]. This information is not intended to replace advice given to you by your health care provider. Make sure you discuss any questions you have with your health care provider.

## 2014-11-23 NOTE — Progress Notes (Signed)
Pre visit review using our clinic review tool, if applicable. No additional management support is needed unless otherwise documented below in the visit note. 

## 2014-11-23 NOTE — Progress Notes (Signed)
   Subjective:    Patient ID: Guy Mcgee, male    DOB: [birth year], 59 y.o.   MRN: [id number]  HPI Seen with difficulty sleeping. He's had marriage issues for well over a year now. His wife recently left him. He has also battled some depression. He denies any active suicidal ideation. He is currently on sertraline 100 mg at night. He usually has great difficulty falling asleep. Also difficulty staying asleep. No daytime naps. No alcohol use. No caffeine use. Increased fatigue. Also history of low testosterone. He is currently not on replacement. He has had extensive counseling and has some ongoing counseling regarding his depression this time.  Past Medical History  Diagnosis Date  . Testosterone deficiency    Past Surgical History  Procedure Laterality Date  . Inguinal herniorrhapy      reports that he has never smoked. He does not have any smokeless tobacco history on file. He reports that he drinks alcohol. He reports that he does not use illicit drugs. family history includes Cancer in his other; Diabetes in his other; Hypertension in his other. No Known Allergies    Review of Systems  Constitutional: Positive for fatigue.  Respiratory: Negative for shortness of breath.   Cardiovascular: Negative for chest pain.  Psychiatric/Behavioral: Positive for sleep disturbance and dysphoric mood. Negative for suicidal ideas and agitation.       Objective:   Physical Exam  Constitutional: He is oriented to person, place, and time. He appears well-developed and well-nourished.  Cardiovascular: Normal rate and regular rhythm.   Pulmonary/Chest: Effort normal and breath sounds normal. No respiratory distress. He has no wheezes. He has no rales.  Neurological: He is alert and oriented to person, place, and time. No cranial nerve deficit.          Assessment & Plan:  Adjustment disorder with depressed mood. He also has significant insomnia. We discussed possible titration of  sertraline. He is somewhat reluctant. He did agree to trial of addition of Abilify 2 mg daily at bedtime. We discussed measures to help reduce insomnia with sleep hygiene. Continue active counseling. Reassess 3 weeks. Consider addition back of testosterone also.

## 2014-12-14 ENCOUNTER — Ambulatory Visit (INDEPENDENT_AMBULATORY_CARE_PROVIDER_SITE_OTHER): Payer: 59 | Admitting: Family Medicine

## 2014-12-14 ENCOUNTER — Encounter: Payer: Self-pay | Admitting: Family Medicine

## 2014-12-14 VITALS — BP 130/80 | HR 98 | Temp 98.6°F | Wt 233.0 lb

## 2014-12-14 DIAGNOSIS — F418 Other specified anxiety disorders: Secondary | ICD-10-CM

## 2014-12-14 DIAGNOSIS — F329 Major depressive disorder, single episode, unspecified: Secondary | ICD-10-CM

## 2014-12-14 DIAGNOSIS — F419 Anxiety disorder, unspecified: Principal | ICD-10-CM

## 2014-12-14 DIAGNOSIS — F32A Depression, unspecified: Secondary | ICD-10-CM

## 2014-12-14 MED ORDER — TESTOSTERONE 20.25 MG/ACT (1.62%) TD GEL: TRANSDERMAL | Status: DC

## 2014-12-14 MED ORDER — ARIPIPRAZOLE 5 MG PO TABS: 5.0000 mg | ORAL_TABLET | Freq: Every day | ORAL | Status: DC

## 2014-12-14 NOTE — Progress Notes (Signed)
Pre visit review using our clinic review tool, if applicable. No additional management support is needed unless otherwise documented below in the visit note. 

## 2014-12-14 NOTE — Progress Notes (Signed)
   Subjective:    Patient ID: Guy Mcgee, male    DOB: [birth year], 59 y.o.   MRN: [id number]  HPI Follow-up regarding adjustment disorder with depressed mood. Refer to prior note. We started Abilify 2 mg at night. He feels that his mind is "racing less" but he still having some difficulty falling asleep. No suicidal ideation. Remains on sertraline 100 mg daily. He does think depression has improved slightly. He is getting regular counseling. He has very little contact with his wife who recently left. He has history of low testosterone and we previously discussed possibly starting that back. He does have some fatigue issues.  Past Medical History  Diagnosis Date  . Testosterone deficiency    Past Surgical History  Procedure Laterality Date  . Inguinal herniorrhapy      reports that he has never smoked. He does not have any smokeless tobacco history on file. He reports that he drinks alcohol. He reports that he does not use illicit drugs. family history includes Cancer in his other; Diabetes in his other; Hypertension in his other. No Known Allergies    Review of Systems  Constitutional: Negative for fever and chills.  Psychiatric/Behavioral: Positive for sleep disturbance and dysphoric mood. Negative for suicidal ideas.       Objective:   Physical Exam  Constitutional: He appears well-developed and well-nourished. No distress.  Neck: Neck supple. No thyromegaly present.  Cardiovascular: Normal rate and regular rhythm.   Pulmonary/Chest: Effort normal and breath sounds normal. No respiratory distress. He has no wheezes. He has no rales.  Psychiatric: He has a normal mood and affect. His behavior is normal. Judgment and thought content normal.          Assessment & Plan:  Depression. Adjustment disorder with depressed mood. Minimal improvement with Abilify. Titrate up to 5 mg at night. Start back testosterone 1.62% 1 pump spray per arm once daily. Continue sertraline.  Continue regular counseling. Reassess in 2 months. Repeat total testosterone level then along with PSA and CBC.

## 2015-01-23 ENCOUNTER — Encounter: Payer: Self-pay | Admitting: Internal Medicine

## 2015-04-10 ENCOUNTER — Telehealth: Payer: Self-pay | Admitting: Family Medicine

## 2015-04-10 NOTE — Telephone Encounter (Signed)
Can you please schedule patient for appointment. To discuss Androgel options.

## 2015-04-10 NOTE — Telephone Encounter (Signed)
Let pt know. We can try Androderm, but he needs follow up CBC, PSA, and testosterone level anyway.  May be best to bring back for labs/visit and can discuss then.

## 2015-04-10 NOTE — Telephone Encounter (Signed)
Per [facility] PA for Androgel is denied. Pt must try and fail Androderm or have two low total serum Testosterone levels in six months.

## 2015-04-10 NOTE — Telephone Encounter (Signed)
Pt has been scheduled.  °

## 2015-04-12 ENCOUNTER — Telehealth: Payer: Self-pay | Admitting: Family Medicine

## 2015-04-12 ENCOUNTER — Ambulatory Visit (INDEPENDENT_AMBULATORY_CARE_PROVIDER_SITE_OTHER): Payer: 59 | Admitting: Family Medicine

## 2015-04-12 ENCOUNTER — Encounter: Payer: Self-pay | Admitting: Family Medicine

## 2015-04-12 VITALS — BP 130/90 | HR 95 | Temp 98.3°F | Wt 242.7 lb

## 2015-04-12 DIAGNOSIS — E291 Testicular hypofunction: Secondary | ICD-10-CM

## 2015-04-12 DIAGNOSIS — R7989 Other specified abnormal findings of blood chemistry: Secondary | ICD-10-CM

## 2015-04-12 LAB — CBC WITH DIFFERENTIAL/PLATELET
BASOS ABS: 0 10*3/uL (ref 0.0–0.1)
Basophils Relative: 0.5 % (ref 0.0–3.0)
EOS PCT: 1.5 % (ref 0.0–5.0)
Eosinophils Absolute: 0.1 10*3/uL (ref 0.0–0.7)
HEMATOCRIT: 47.9 % (ref 39.0–52.0)
Hemoglobin: 16.5 g/dL (ref 13.0–17.0)
LYMPHS PCT: 21.9 % (ref 12.0–46.0)
Lymphs Abs: 1.4 10*3/uL (ref 0.7–4.0)
MCHC: 34.3 g/dL (ref 30.0–36.0)
MCV: 88.4 fl (ref 78.0–100.0)
Monocytes Absolute: 0.6 10*3/uL (ref 0.1–1.0)
Monocytes Relative: 9.1 % (ref 3.0–12.0)
Neutro Abs: 4.4 10*3/uL (ref 1.4–7.7)
Neutrophils Relative %: 67 % (ref 43.0–77.0)
Platelets: 237 10*3/uL (ref 150.0–400.0)
RBC: 5.42 Mil/uL (ref 4.22–5.81)
RDW: 13.1 % (ref 11.5–15.5)
WBC: 6.6 10*3/uL (ref 4.0–10.5)

## 2015-04-12 LAB — LIPID PANEL
CHOLESTEROL: 183 mg/dL (ref 0–200)
HDL: 48 mg/dL (ref >39.00)
LDL CALC: 117 mg/dL — AB (ref 0–99)
NonHDL: 134.8
Total CHOL/HDL Ratio: 4
Triglycerides: 88 mg/dL (ref 0.0–149.0)
VLDL: 17.6 mg/dL (ref 0.0–40.0)

## 2015-04-12 LAB — BASIC METABOLIC PANEL
BUN: 26 mg/dL — AB (ref 6–23)
CHLORIDE: 106 meq/L (ref 96–112)
CO2: 26 mEq/L (ref 19–32)
Calcium: 9.8 mg/dL (ref 8.4–10.5)
Creatinine, Ser: 1.2 mg/dL (ref 0.40–1.50)
GFR: 66.37 mL/min (ref >60.00)
GLUCOSE: 93 mg/dL (ref 70–99)
Potassium: 5 mEq/L (ref 3.5–5.1)
Sodium: 142 mEq/L (ref 135–145)

## 2015-04-12 LAB — HEPATIC FUNCTION PANEL
ALBUMIN: 4.5 g/dL (ref 3.5–5.2)
ALT: 24 U/L (ref 0–53)
AST: 19 U/L (ref 0–37)
Alkaline Phosphatase: 59 U/L (ref 39–117)
BILIRUBIN TOTAL: 0.7 mg/dL (ref 0.2–1.2)
Bilirubin, Direct: 0.1 mg/dL (ref 0.0–0.3)
TOTAL PROTEIN: 7.2 g/dL (ref 6.0–8.3)

## 2015-04-12 LAB — PSA: PSA: 0.63 ng/mL (ref 0.10–4.00)

## 2015-04-12 NOTE — Progress Notes (Signed)
Pre visit review using our clinic review tool, if applicable. No additional management support is needed unless otherwise documented below in the visit note. 

## 2015-04-12 NOTE — Patient Instructions (Signed)
Return in one month for testosterone level after getting back on daily topical replacement

## 2015-04-12 NOTE — Progress Notes (Signed)
   Subjective:    Patient ID: Guy Mcgee, male    DOB: 10/31/1955, 59 y.o.   MRN: [id number]  HPI Patient seen for follow-up regarding low testosterone. He had run out of his topical testosterone until recently and has still not gotten his new prescription filled yet. He has not had CBC or PSA in over year and we had requested he come back for labs. He's had low libido and low energy and also intermittent depressed mood. He's had counseling and is still dealing with stress of his wife leaving over year ago. He took himself off sertraline and Abilify. He feels stable mood- wise at this point and feels that he is slowly recovering there.  Past Medical History  Diagnosis Date  . Testosterone deficiency    Past Surgical History  Procedure Laterality Date  . Inguinal herniorrhapy      reports that he has never smoked. He does not have any smokeless tobacco history on file. He reports that he drinks alcohol. He reports that he does not use illicit drugs. family history includes Cancer in his other; Diabetes in his other; Hypertension in his other. No Known Allergies    Review of Systems  Constitutional: Negative for fatigue.  Eyes: Negative for visual disturbance.  Respiratory: Negative for cough, chest tightness and shortness of breath.   Cardiovascular: Negative for chest pain, palpitations and leg swelling.  Neurological: Negative for dizziness, syncope, weakness, light-headedness and headaches.       Objective:   Physical Exam  Constitutional: He is oriented to person, place, and time. He appears well-developed and well-nourished.  HENT:  Right Ear: External ear normal.  Left Ear: External ear normal.  Mouth/Throat: Oropharynx is clear and moist.  Eyes: Pupils are equal, round, and reactive to light.  Neck: Neck supple. No thyromegaly present.  Cardiovascular: Normal rate and regular rhythm.   Pulmonary/Chest: Effort normal and breath sounds normal. No respiratory distress.  He has no wheezes. He has no rales.  Musculoskeletal: He exhibits no edema.  Neurological: He is alert and oriented to person, place, and time.          Assessment & Plan:  Low testosterone. Recheck labs with PSA, CBC, hepatic, lipid panel. He will start back topical AndroGel 1.62% 1 pump spray per arm once daily and recheck total testosterone level in one month. Patient declines flu vaccine

## 2015-04-12 NOTE — Telephone Encounter (Signed)
Pt was mistaken and his Androgel was not approved. Will need a rx for Androdem if you agree.

## 2015-04-15 NOTE — Telephone Encounter (Signed)
Androderm patch 4 mg/24 hours- apply one patch daily. #30 patches with 3 refills.

## 2015-04-16 MED ORDER — TESTOSTERONE 4 MG/24HR TD PT24: 1.0000 | MEDICATED_PATCH | Freq: Every day | TRANSDERMAL | Status: DC

## 2015-04-16 NOTE — Telephone Encounter (Signed)
Faxed Rx to pharmacy. Pt is aware.

## 2015-05-13 ENCOUNTER — Other Ambulatory Visit: Payer: 59

## 2015-05-14 ENCOUNTER — Other Ambulatory Visit (INDEPENDENT_AMBULATORY_CARE_PROVIDER_SITE_OTHER): Payer: 59

## 2015-05-14 DIAGNOSIS — E291 Testicular hypofunction: Secondary | ICD-10-CM | POA: Diagnosis not present

## 2015-05-14 DIAGNOSIS — R7989 Other specified abnormal findings of blood chemistry: Secondary | ICD-10-CM

## 2015-05-14 LAB — TESTOSTERONE: Testosterone: 177.74 ng/dL — ABNORMAL LOW (ref 300.00–890.00)

## 2015-05-15 ENCOUNTER — Telehealth: Payer: Self-pay

## 2015-05-15 NOTE — Telephone Encounter (Signed)
Patient says he is using the patch.  His insurance would not cover the gel.  He has a follow up appointment made.

## 2015-05-15 NOTE — Telephone Encounter (Signed)
-----   Message from Eulas Post, MD sent at 05/14/2015  4:54 PM EDT ----- Testosterone is still very low. Confirm that he has been taking the topical Androgel consistently.  If so, go ahead and increase to 2 pump sprays PER ARM once daily. Repeat testosterone level in about 2 months.

## 2015-05-20 NOTE — Telephone Encounter (Signed)
Based on his recent low testosterone level, I would increase this to 2 patches per 24 hours.  This will need to refilled for #60 patches with 5 refills with the above instructions.

## 2015-08-04 DIAGNOSIS — K429 Umbilical hernia without obstruction or gangrene: Secondary | ICD-10-CM

## 2015-08-04 HISTORY — DX: Umbilical hernia without obstruction or gangrene: K42.9

## 2015-08-13 ENCOUNTER — Other Ambulatory Visit (INDEPENDENT_AMBULATORY_CARE_PROVIDER_SITE_OTHER): Payer: 59

## 2015-08-13 DIAGNOSIS — E250 Congenital adrenogenital disorders associated with enzyme deficiency: Secondary | ICD-10-CM

## 2015-08-13 DIAGNOSIS — E259 Adrenogenital disorder, unspecified: Secondary | ICD-10-CM | POA: Diagnosis not present

## 2015-08-13 LAB — TESTOSTERONE: Testosterone: 169.05 ng/dL — ABNORMAL LOW (ref 300.00–890.00)

## 2015-08-14 MED ORDER — TESTOSTERONE 4 MG/24HR TD PT24: MEDICATED_PATCH | TRANSDERMAL | Status: DC

## 2015-09-02 ENCOUNTER — Telehealth: Payer: Self-pay | Admitting: Family Medicine

## 2015-09-02 MED ORDER — TESTOSTERONE 4 MG/24HR TD PT24: MEDICATED_PATCH | TRANSDERMAL | Status: DC

## 2015-09-02 NOTE — Telephone Encounter (Signed)
Refaxed to Pharmacy.

## 2015-09-02 NOTE — Telephone Encounter (Signed)
Pt request refill of the following: testosterone (ANDRODERM) 4 MG/24HR PT24 patch  Pt contacted the pharmacy about the above rx that was called in 08/14/15 and they told him they did not received the request     Phamacy:  [facility] [address]

## 2015-09-03 NOTE — Telephone Encounter (Signed)
[facility] [address] never received pt's rx testosterone (ANDRODERM) 4 MG/24HR PT24 patch  If you want, pt can pick up if you prefer. Can you resend?  Looks like it was printed.

## 2015-09-03 NOTE — Telephone Encounter (Signed)
Verbally called into pharmacy.

## 2015-09-05 ENCOUNTER — Telehealth: Payer: Self-pay | Admitting: Family Medicine

## 2015-09-05 NOTE — Telephone Encounter (Signed)
Error

## 2015-09-09 ENCOUNTER — Telehealth: Payer: Self-pay | Admitting: Family Medicine

## 2015-09-09 NOTE — Telephone Encounter (Signed)
[employer] faxed notification that prior authorization for testosterone (ANDRODERM) 4 MG/24HR PT24 patch has been denied.  The request submitted was for #60 androderm 4mg /24 hour is denied.  The patient's plan will only allows them to receive #31 patches for one month.  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

## 2015-09-10 ENCOUNTER — Telehealth: Payer: Self-pay | Admitting: Family Medicine

## 2015-09-10 NOTE — Telephone Encounter (Signed)
Pt increased for the following med was denied by his insurance testosterone (ANDRODERM) 4 MG/24HR PT24 patch. Pt request a refill on what he has been taking  testosterone (ANDRODERM) 4 MG/24HR PT24 patch

## 2015-09-10 NOTE — Telephone Encounter (Signed)
I dont understand this message. Want to ensure the refill request is correct. Please advise.

## 2015-09-11 MED ORDER — TESTOSTERONE 4 MG/24HR TD PT24: MEDICATED_PATCH | TRANSDERMAL | Status: DC

## 2015-09-11 NOTE — Telephone Encounter (Signed)
RX printed for signature. 

## 2015-09-11 NOTE — Telephone Encounter (Signed)
New RX faxed to [facility].

## 2015-09-17 ENCOUNTER — Other Ambulatory Visit: Payer: Self-pay | Admitting: Family Medicine

## 2015-09-17 ENCOUNTER — Telehealth: Payer: Self-pay

## 2015-09-17 NOTE — Telephone Encounter (Signed)
Please resend the following prescription.  Jessica at [facility] [address] said they have not received it and the patient is very upset about it: testosterone (ANDRODERM) 4 MG/24HR PT24 patch

## 2015-09-17 NOTE — Telephone Encounter (Signed)
Patient called stating [facility] had not received Rx for Testosterone (Androderm) - I called in Rx to pharmacy. Patient notified.

## 2015-10-01 ENCOUNTER — Encounter: Payer: Self-pay | Admitting: Family Medicine

## 2015-10-01 ENCOUNTER — Ambulatory Visit (INDEPENDENT_AMBULATORY_CARE_PROVIDER_SITE_OTHER): Payer: 59 | Admitting: Family Medicine

## 2015-10-01 VITALS — BP 138/88 | HR 102 | Temp 98.8°F | Ht 71.0 in | Wt 240.0 lb

## 2015-10-01 DIAGNOSIS — F419 Anxiety disorder, unspecified: Secondary | ICD-10-CM

## 2015-10-01 DIAGNOSIS — F32A Depression, unspecified: Secondary | ICD-10-CM

## 2015-10-01 DIAGNOSIS — K429 Umbilical hernia without obstruction or gangrene: Secondary | ICD-10-CM | POA: Insufficient documentation

## 2015-10-01 DIAGNOSIS — F418 Other specified anxiety disorders: Secondary | ICD-10-CM | POA: Diagnosis not present

## 2015-10-01 DIAGNOSIS — R7989 Other specified abnormal findings of blood chemistry: Secondary | ICD-10-CM

## 2015-10-01 DIAGNOSIS — E291 Testicular hypofunction: Secondary | ICD-10-CM

## 2015-10-01 DIAGNOSIS — F329 Major depressive disorder, single episode, unspecified: Secondary | ICD-10-CM

## 2015-10-01 MED ORDER — BUPROPION HCL ER (XL) 300 MG PO TB24: 300.0000 mg | ORAL_TABLET | Freq: Every day | ORAL | Status: DC

## 2015-10-01 MED ORDER — BUPROPION HCL ER (XL) 150 MG PO TB24: 150.0000 mg | ORAL_TABLET | Freq: Every day | ORAL | Status: DC

## 2015-10-01 MED ORDER — TESTOSTERONE CYPIONATE 200 MG/ML IM SOLN: 200.0000 mg | INTRAMUSCULAR | Status: DC

## 2015-10-01 NOTE — Progress Notes (Signed)
Pre visit review using our clinic review tool, if applicable. No additional management support is needed unless otherwise documented below in the visit note. 

## 2015-10-01 NOTE — Progress Notes (Signed)
   Subjective:    Patient ID: Guy Mcgee, male    DOB: 05-01-1956, 60 y.o.   MRN: [id number]  HPI  Patient here for follow-up   Increased stress during the past year with separation from his wife and likely impending divorce.  He continues to have mood disturbance with mostly depressed mood and difficulty concentrating and very low motivation. No suicidal ideation. Previously took sertraline and also low-dose Abilify but did not feel like they helped much. He has some mild anxiety but mostly depressive symptoms. No suicidal ideation.   Low testosterone. His insurance covered patch but with one patch he had recent low level 169. We had recommended increasing to 2 patches per day with insurance would not cover. He would like to consider injectable options. He's had tremendous fatigue which he thinks is related. At one point his insurance did cover AndroGel topical and 1 levels were normal and he felt better overall. Recent PSA and CBC unremarkable  About one month ago he noted swelling around the umbilicus.  Increases with straining.  Non-painful No redness or warmth.     Past Medical History  Diagnosis Date  . Testosterone deficiency    Past Surgical History  Procedure Laterality Date  . Inguinal herniorrhapy      reports that he has never smoked. He does not have any smokeless tobacco history on file. He reports that he drinks alcohol. He reports that he does not use illicit drugs. family history includes Cancer in his other; Diabetes in his other; Hypertension in his other. No Known Allergies    Review of Systems  Constitutional: Positive for fatigue. Negative for unexpected weight change.  Eyes: Negative for visual disturbance.  Respiratory: Negative for cough, chest tightness and shortness of breath.   Cardiovascular: Negative for chest pain, palpitations and leg swelling.  Gastrointestinal: Negative for nausea, vomiting and abdominal pain.  Neurological: Negative for  dizziness, syncope, weakness, light-headedness and headaches.  Psychiatric/Behavioral: Positive for dysphoric mood. Negative for suicidal ideas and confusion.       Objective:   Physical Exam  Constitutional: He is oriented to person, place, and time. He appears well-developed and well-nourished.  Cardiovascular: Normal rate and regular rhythm.   Pulmonary/Chest: Effort normal. No respiratory distress. He has no wheezes. He has no rales.  Abdominal: Soft. There is no tenderness.  Umbilical hernia- soft and non-tender and easily reducible.  Neurological: He is alert and oriented to person, place, and time.  Skin: No rash noted.  Psychiatric: He has a normal mood and affect. His behavior is normal. Judgment and thought content normal.          Assessment & Plan:   #1 low testosterone. Levels did not increase much with topical patch. We discussed option of intramuscular testosterone cypionate 200 mg every 2 weeks and plan repeat level in about one month.   #2 major depression. PH Q-9 score of 15. He's had counseling in the past. He is describing low motivation and low energy which may be partly related to #1 above. Start Wellbutrin XL 150 mg once daily for 2 weeks. If tolerating well that point increased to 300 mg once daily and reassess here in one month  #3 Umbilical hernia, non-strangulated.  Reassurance.  Reviewed signs and symptoms of strangulation.

## 2015-10-02 ENCOUNTER — Telehealth: Payer: Self-pay | Admitting: Family Medicine

## 2015-10-02 MED ORDER — "SYRINGE/NEEDLE (DISP) 22G X 1-1/2"" 3 ML MISC": Status: DC

## 2015-10-02 NOTE — Telephone Encounter (Signed)
Please advise 

## 2015-10-02 NOTE — Telephone Encounter (Signed)
Rx was send to the pharmacy

## 2015-10-02 NOTE — Telephone Encounter (Signed)
Pt has found nurse  to do his  Testosterone injection. [facility] [address]

## 2015-10-02 NOTE — Telephone Encounter (Signed)
Pt needs a script for needles to inject his testosterone.  [facility] [address]

## 2015-10-02 NOTE — Telephone Encounter (Signed)
Confirm if he found someone to do his insulin injections.  If so, may send in rx for needles.  Obviously, if he is going to get injections here will not need prescription for needles

## 2015-10-15 ENCOUNTER — Ambulatory Visit (INDEPENDENT_AMBULATORY_CARE_PROVIDER_SITE_OTHER): Payer: 59 | Admitting: Family Medicine

## 2015-10-15 ENCOUNTER — Other Ambulatory Visit: Payer: 59

## 2015-10-15 DIAGNOSIS — E291 Testicular hypofunction: Secondary | ICD-10-CM | POA: Diagnosis not present

## 2015-10-15 DIAGNOSIS — R7989 Other specified abnormal findings of blood chemistry: Secondary | ICD-10-CM

## 2015-10-15 MED ORDER — TESTOSTERONE CYPIONATE 200 MG/ML IM SOLN
200.0000 mg | INTRAMUSCULAR | Status: DC
  Administered 2015-10-15: 200 mg via INTRAMUSCULAR

## 2015-10-29 ENCOUNTER — Other Ambulatory Visit (INDEPENDENT_AMBULATORY_CARE_PROVIDER_SITE_OTHER): Payer: 59

## 2015-10-29 ENCOUNTER — Ambulatory Visit (INDEPENDENT_AMBULATORY_CARE_PROVIDER_SITE_OTHER): Payer: 59 | Admitting: Family Medicine

## 2015-10-29 DIAGNOSIS — E349 Endocrine disorder, unspecified: Secondary | ICD-10-CM

## 2015-10-29 DIAGNOSIS — E291 Testicular hypofunction: Secondary | ICD-10-CM

## 2015-10-29 LAB — TESTOSTERONE: Testosterone: 380.27 ng/dL (ref 300.00–890.00)

## 2015-10-29 MED ORDER — TESTOSTERONE CYPIONATE 200 MG/ML IM SOLN
200.0000 mg | Freq: Once | INTRAMUSCULAR | Status: AC
  Administered 2015-10-29: 200 mg via INTRAMUSCULAR

## 2015-11-07 ENCOUNTER — Telehealth: Payer: Self-pay | Admitting: Family Medicine

## 2015-11-07 MED ORDER — "NEEDLE (DISP) 25G X 5/8"" MISC": Status: DC

## 2015-11-07 NOTE — Telephone Encounter (Signed)
Pt states his insurance will not cover his testosterone injections here in the office. Pt would like a rx for needles sent to [place] states he has someone (a nurse) who is willing to give his injection to him one time month.

## 2015-11-07 NOTE — Telephone Encounter (Signed)
Needle and Syringes sent in for patient.

## 2016-02-26 ENCOUNTER — Other Ambulatory Visit: Payer: Self-pay | Admitting: Family Medicine

## 2016-03-27 ENCOUNTER — Other Ambulatory Visit: Payer: Self-pay | Admitting: Family Medicine

## 2016-03-28 NOTE — Telephone Encounter (Signed)
He had been doing injections, so I am confused for the request for patch.  Please clarify.

## 2016-03-30 NOTE — Telephone Encounter (Signed)
Left message for patient to call us back. Has he gone back to the patch as apposed to the injection?

## 2016-03-30 NOTE — Telephone Encounter (Signed)
He was having someone do his testosterone injections at home.  Is he now requesting going back on topical?

## 2016-08-27 ENCOUNTER — Other Ambulatory Visit: Payer: Self-pay | Admitting: Family Medicine

## 2017-01-01 ENCOUNTER — Telehealth: Payer: Self-pay

## 2017-01-01 NOTE — Telephone Encounter (Signed)
Received PA request for Testosterone. PA submitted with 2 testosterone labs & is pending. Key: [id number]

## 2017-01-05 NOTE — Telephone Encounter (Signed)
PA approved, form faxed back to pharmacy. 

## 2017-01-11 ENCOUNTER — Telehealth: Payer: Self-pay | Admitting: *Deleted

## 2017-01-11 NOTE — Telephone Encounter (Signed)
Needs follow-up labs with CBC, PSA, and total testosterone level. He needs to get these labs before further refills

## 2017-01-11 NOTE — Telephone Encounter (Signed)
Patient requesting a refill of testosterone cypionate (DEPOTESTOSTERONE CYPIONATE) 200 MG/ML injection  Last office visit 09/30/16 Last refill 08/28/16 Last lab for testosterone 10/29/15  Okay to fill?

## 2017-01-12 ENCOUNTER — Other Ambulatory Visit (INDEPENDENT_AMBULATORY_CARE_PROVIDER_SITE_OTHER): Payer: BLUE CROSS/BLUE SHIELD

## 2017-01-12 ENCOUNTER — Other Ambulatory Visit: Payer: Self-pay | Admitting: Family Medicine

## 2017-01-12 ENCOUNTER — Telehealth: Payer: Self-pay | Admitting: *Deleted

## 2017-01-12 DIAGNOSIS — R7989 Other specified abnormal findings of blood chemistry: Secondary | ICD-10-CM | POA: Diagnosis not present

## 2017-01-12 DIAGNOSIS — R5383 Other fatigue: Secondary | ICD-10-CM

## 2017-01-12 LAB — CBC WITH DIFFERENTIAL/PLATELET
Basophils Absolute: 0 10*3/uL (ref 0.0–0.1)
Basophils Relative: 0.4 % (ref 0.0–3.0)
EOS ABS: 0.1 10*3/uL (ref 0.0–0.7)
Eosinophils Relative: 1.4 % (ref 0.0–5.0)
HEMATOCRIT: 58.9 % — AB (ref 39.0–52.0)
Lymphocytes Relative: 19.9 % (ref 12.0–46.0)
Lymphs Abs: 1.7 10*3/uL (ref 0.7–4.0)
MCHC: 33.5 g/dL (ref 30.0–36.0)
MCV: 91.5 fl (ref 78.0–100.0)
MONO ABS: 0.7 10*3/uL (ref 0.1–1.0)
MONOS PCT: 8.6 % (ref 3.0–12.0)
NEUTROS ABS: 5.9 10*3/uL (ref 1.4–7.7)
Neutrophils Relative %: 69.7 % (ref 43.0–77.0)
Platelets: 210 10*3/uL (ref 150.0–400.0)
RBC: 6.42 Mil/uL — AB (ref 4.22–5.81)
RDW: 14.5 % (ref 11.5–15.5)
WBC: 8.5 10*3/uL (ref 4.0–10.5)

## 2017-01-12 LAB — PSA: PSA: 0.8 ng/mL (ref 0.10–4.00)

## 2017-01-12 NOTE — Telephone Encounter (Signed)
Patient is aware of lab results and a follow up appointment made

## 2017-01-12 NOTE — Telephone Encounter (Signed)
  CRITICAL VALUE STICKER  CRITICAL VALUE: HG 19.6 HCT 58.9  RECEIVER (on-site recipient of call): Algis Downs, [place] NOTIFIED: 4:24 PM 01/12/17   MESSENGER (representative from lab): Margarita Grizzle @ [facility]  MD NOTIFIED: Dr. Elease Hashimoto  TIME OF NOTIFICATION: 4:25 PM    RESPONSE: awaiting MD response

## 2017-01-12 NOTE — Telephone Encounter (Signed)
Lab appointment made.  Orders placed.

## 2017-01-13 LAB — CP2130 TESTOSTERONE, TOTAL AND FREE
SEX HORMONE BINDING: 35 nmol/L (ref 22–77)
TESTOSTERONE FREE: 390.9 pg/mL — AB (ref 47.0–244.0)
TESTOSTERONE-% FREE: 2.6 % (ref 1.6–2.9)
TESTOSTERONE: 1498 ng/dL — AB (ref 300–890)

## 2017-01-22 ENCOUNTER — Encounter: Payer: Self-pay | Admitting: Family Medicine

## 2017-01-22 ENCOUNTER — Ambulatory Visit (INDEPENDENT_AMBULATORY_CARE_PROVIDER_SITE_OTHER): Payer: BLUE CROSS/BLUE SHIELD | Admitting: Family Medicine

## 2017-01-22 VITALS — BP 118/80 | HR 80 | Temp 98.7°F | Wt 257.4 lb

## 2017-01-22 DIAGNOSIS — D751 Secondary polycythemia: Secondary | ICD-10-CM | POA: Diagnosis not present

## 2017-01-22 DIAGNOSIS — R7989 Other specified abnormal findings of blood chemistry: Secondary | ICD-10-CM

## 2017-01-22 NOTE — Progress Notes (Signed)
Subjective:     Patient ID: Guy Mcgee, male   DOB: [birth year], 61 y.o.   MRN: [id number]  HPI Patient here to discuss recent abnormal lab work. He has history of low testosterone and has been on replacement with 200 mg intramuscular every 2 weeks. He and recent lab work and hemoglobin came back 19.6 with hematocrit 58.9. His PSA was normal. Testosterone level was elevated at 1498 but this was taken just couple days after his most recent injection. His hematocrit had been consistently normal prior to that.  Past Medical History:  Diagnosis Date  . Testosterone deficiency    Past Surgical History:  Procedure Laterality Date  . inguinal herniorrhapy      reports that he has never smoked. He has never used smokeless tobacco. He reports that he drinks alcohol. He reports that he does not use drugs. family history includes Cancer in his other; Diabetes in his other; Hypertension in his other. No Known Allergies   Review of Systems  Constitutional: Negative for fatigue.  Eyes: Negative for visual disturbance.  Respiratory: Negative for cough, chest tightness and shortness of breath.   Cardiovascular: Negative for chest pain, palpitations and leg swelling.  Neurological: Negative for dizziness, syncope, weakness, light-headedness and headaches.       Objective:   Physical Exam  Constitutional: He is oriented to person, place, and time. He appears well-developed and well-nourished.  HENT:  Right Ear: External ear normal.  Left Ear: External ear normal.  Mouth/Throat: Oropharynx is clear and moist.  Eyes: Pupils are equal, round, and reactive to light.  Neck: Neck supple. No thyromegaly present.  Cardiovascular: Normal rate and regular rhythm.   Pulmonary/Chest: Effort normal and breath sounds normal. No respiratory distress. He has no wheezes. He has no rales.  Musculoskeletal: He exhibits no edema.  Neurological: He is alert and oriented to person, place, and time.        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 We also explained some people have secondary polycythemia at any dose of replacement.  Eulas Post MD [facility]

## 2017-01-22 NOTE — Patient Instructions (Signed)
STOP the testosterone We will repeat CBC in 2 months and if OK at that point will start back lower dose at that time.

## 2017-03-02 ENCOUNTER — Ambulatory Visit (INDEPENDENT_AMBULATORY_CARE_PROVIDER_SITE_OTHER): Payer: BLUE CROSS/BLUE SHIELD | Admitting: Family Medicine

## 2017-03-02 ENCOUNTER — Encounter: Payer: Self-pay | Admitting: Family Medicine

## 2017-03-02 VITALS — BP 120/80 | HR 114 | Temp 98.7°F | Wt 255.8 lb

## 2017-03-02 DIAGNOSIS — Z23 Encounter for immunization: Secondary | ICD-10-CM

## 2017-03-02 DIAGNOSIS — S61258A Open bite of other finger without damage to nail, initial encounter: Secondary | ICD-10-CM | POA: Diagnosis not present

## 2017-03-02 DIAGNOSIS — W5501XA Bitten by cat, initial encounter: Secondary | ICD-10-CM

## 2017-03-02 MED ORDER — AMOXICILLIN-POT CLAVULANATE 875-125 MG PO TABS: 1.0000 | ORAL_TABLET | Freq: Two times a day (BID) | ORAL | 0 refills | Status: AC

## 2017-03-02 NOTE — Progress Notes (Signed)
Subjective:     Patient ID: Guy Mcgee, male   DOB: 05/27/1956, 61 y.o.   MRN: [id number]  HPI Patient seen with cat bite left index finger. This occurred yesterday late. He was helping his dad apply some flea powder to the cat and the cat apparently resulted. He had a bite to the index finger. Cat is indoors and has been fully vaccinated for rabies. Patient's last tetanus 2005.  He noticed some increased soreness and swelling of the index finger today. No fevers or chills. No known allergies.  Past Medical History:  Diagnosis Date  . Testosterone deficiency    Past Surgical History:  Procedure Laterality Date  . inguinal herniorrhapy      reports that he has never smoked. He has never used smokeless tobacco. He reports that he drinks alcohol. He reports that he does not use drugs. family history includes Cancer in his other; Diabetes in his other; Hypertension in his other. No Known Allergies   Review of Systems  Constitutional: Negative for chills and fever.       Objective:   Physical Exam  Constitutional: He appears well-developed and well-nourished.  Cardiovascular: Normal rate and regular rhythm.   Musculoskeletal:  Left index finger full range of motion all joints. Does have some very mild swelling around the distal half of the finger. He has a very small puncture wound just proximal to the DIP joint. He does not have any increased warmth or visible erythema but does have some mild tenderness to palpation over the puncture area.       Assessment:     Cat bite left index finger. Patient has some mild swelling and soreness but no obvious erythema or warmth. Very high risk for worsening infection.  Their cat has been fully vaccinated for rabies    Plan:     -Tetanus booster given -Elevate hand frequently -Start Augmentin 875 mg twice daily -Follow-up promptly for any fever or progressive redness swelling or other concerning changes  Eulas Post MD [facility]

## 2017-03-02 NOTE — Patient Instructions (Signed)
Animal Bite Animal bites can range from mild to serious. An animal bite can result in a scratch on the skin, a deep open cut, a puncture of the skin, a crush injury, or tearing away of the skin or a body part. A small bite from a house pet will usually not cause serious problems. However, some animal bites can become infected or injure a bone or other tissue. Bites from certain animals can be more dangerous because of the risk of spreading rabies, which is a serious viral infection. This risk is higher with bites from stray animals or wild animals, such as raccoons, foxes, skunks, and bats. Dogs are responsible for most animal bites. Children are bitten more often than adults. What are the signs or symptoms? Common symptoms of an animal bite include:  Pain.  Bleeding.  Swelling.  Bruising.  How is this diagnosed? This condition may be diagnosed based on a physical exam and medical history. Your health care provider will examine the wound and ask for details about the animal and how the bite happened. You may also have tests, such as:  Blood tests to check for infection or to determine if surgery is needed.  X-rays to check for damage to bones or joints.  Culture test. This uses a sample of fluid from the wound to check for infection.  How is this treated? Treatment varies depending on the location and type of animal bite and your medical history. Treatment may include:  Wound care. This often includes cleaning the wound, flushing the wound with saline solution, and applying a bandage (dressing). Sometimes, the wound is left open to heal because of the high risk of infection. However, in some cases, the wound may be closed with stitches (sutures), staples, skin glue, or adhesive strips.  Antibiotic medicine.  Tetanus shot.  Rabies treatment if the animal could have rabies.  In some cases, bites that have become infected may require IV antibiotics and surgical treatment in the  hospital. Follow these instructions at home: Wound care  Follow instructions from your health care provider about how to take care of your wound. Make sure you: ? Wash your hands with soap and water before you change your dressing. If soap and water are not available, use hand sanitizer. ? Change your dressing as told by your health care provider. ? Leave sutures, skin glue, or adhesive strips in place. These skin closures may need to be in place for 2 weeks or longer. If adhesive strip edges start to loosen and curl up, you may trim the loose edges. Do not remove adhesive strips completely unless your health care provider tells you to do that.  Check your wound every day for signs of infection. Watch for: ? Increasing redness, swelling, or pain. ? Fluid, blood, or pus. General instructions  Take or apply over-the-counter and prescription medicines only as told by your health care provider.  If you were prescribed an antibiotic, take or apply it as told by your health care provider. Do not stop using the antibiotic even if your condition improves.  Keep the injured area raised (elevated) above the level of your heart while you are sitting or lying down, if this is possible.  If directed, apply ice to the injured area. ? Put ice in a plastic bag. ? Place a towel between your skin and the bag. ? Leave the ice on for 20 minutes, 2-3 times per day.  Keep all follow-up visits as told by your health care   provider. This is important. Contact a health care provider if:  You have increasing redness, swelling, or pain at the site of your wound.  You have a general feeling of sickness (malaise).  You feel nauseous or you vomit.  You have pain that does not get better. Get help right away if:  You have a red streak extending away from your wound.  You have fluid, blood, or pus coming from your wound.  You have a fever or chills.  You have trouble moving your injured area.  You have  numbness or tingling extending beyond the wound. This information is not intended to replace advice given to you by your health care provider. Make sure you discuss any questions you have with your health care provider. Document Released: 04/07/2011 Document Revised: 11/27/2015 Document Reviewed: 12/05/2014 Elsevier Interactive Patient Education  2018 [employer]  

## 2017-03-26 ENCOUNTER — Other Ambulatory Visit (INDEPENDENT_AMBULATORY_CARE_PROVIDER_SITE_OTHER): Payer: BLUE CROSS/BLUE SHIELD

## 2017-03-26 DIAGNOSIS — D751 Secondary polycythemia: Secondary | ICD-10-CM

## 2017-03-26 LAB — CBC WITH DIFFERENTIAL/PLATELET
BASOS PCT: 0.3 % (ref 0.0–3.0)
Basophils Absolute: 0 10*3/uL (ref 0.0–0.1)
EOS ABS: 0.2 10*3/uL (ref 0.0–0.7)
EOS PCT: 2.7 % (ref 0.0–5.0)
HCT: 50.6 % (ref 39.0–52.0)
HEMOGLOBIN: 16.8 g/dL (ref 13.0–17.0)
LYMPHS ABS: 1.8 10*3/uL (ref 0.7–4.0)
Lymphocytes Relative: 26.2 % (ref 12.0–46.0)
MCHC: 33.2 g/dL (ref 30.0–36.0)
MCV: 91.7 fl (ref 78.0–100.0)
Monocytes Absolute: 0.7 10*3/uL (ref 0.1–1.0)
Monocytes Relative: 9.9 % (ref 3.0–12.0)
NEUTROS ABS: 4.2 10*3/uL (ref 1.4–7.7)
NEUTROS PCT: 60.9 % (ref 43.0–77.0)
PLATELETS: 214 10*3/uL (ref 150.0–400.0)
RBC: 5.52 Mil/uL (ref 4.22–5.81)
RDW: 14.2 % (ref 11.5–15.5)
WBC: 6.9 10*3/uL (ref 4.0–10.5)

## 2017-04-06 ENCOUNTER — Telehealth: Payer: Self-pay | Admitting: Family Medicine

## 2017-04-06 MED ORDER — TESTOSTERONE CYPIONATE 100 MG/ML IM SOLN: 100.0000 mg | INTRAMUSCULAR | 0 refills | Status: DC

## 2017-04-06 NOTE — Addendum Note (Signed)
Addended by: Agnes Lawrence on: 04/06/2017 10:36 AM   Modules accepted: Orders

## 2017-04-06 NOTE — Telephone Encounter (Signed)
[facility] needs clarification on testosterone

## 2017-04-07 MED ORDER — TESTOSTERONE CYPIONATE 100 MG/ML IM SOLN: 100.0000 mg | INTRAMUSCULAR | 0 refills | Status: DC

## 2017-04-07 NOTE — Telephone Encounter (Signed)
Spoke with patient and he said that it is okay for him to start testosterone 100 mg/ml.  Is this okay to call in?

## 2017-04-07 NOTE — Telephone Encounter (Signed)
Rx called in 

## 2017-04-07 NOTE — Telephone Encounter (Signed)
Yes- testosterone cypionate 100 mg/ml and give 1 ml IM every 2 weeks.

## 2017-04-08 ENCOUNTER — Ambulatory Visit (INDEPENDENT_AMBULATORY_CARE_PROVIDER_SITE_OTHER)
Admission: RE | Admit: 2017-04-08 | Discharge: 2017-04-08 | Disposition: A | Discharge status: Home / Self Care | Payer: BLUE CROSS/BLUE SHIELD | Source: Ambulatory Visit | Attending: Adult Health | Admitting: Adult Health

## 2017-04-08 ENCOUNTER — Ambulatory Visit (INDEPENDENT_AMBULATORY_CARE_PROVIDER_SITE_OTHER): Payer: BLUE CROSS/BLUE SHIELD | Admitting: Adult Health

## 2017-04-08 ENCOUNTER — Encounter: Payer: Self-pay | Admitting: Adult Health

## 2017-04-08 ENCOUNTER — Other Ambulatory Visit: Payer: Self-pay | Admitting: Adult Health

## 2017-04-08 VITALS — BP 134/90 | Temp 98.5°F | Wt 257.0 lb

## 2017-04-08 DIAGNOSIS — W5501XD Bitten by cat, subsequent encounter: Secondary | ICD-10-CM

## 2017-04-08 DIAGNOSIS — S61258D Open bite of other finger without damage to nail, subsequent encounter: Secondary | ICD-10-CM | POA: Diagnosis not present

## 2017-04-08 MED ORDER — AMOXICILLIN-POT CLAVULANATE 875-125 MG PO TABS: 1.0000 | ORAL_TABLET | Freq: Two times a day (BID) | ORAL | 0 refills | Status: DC

## 2017-04-08 NOTE — Progress Notes (Signed)
   Subjective:    Patient ID: Guy Mcgee, male    DOB: 1956-07-05, 61 y.o.   MRN: [id number]  HPI  61 year old male who  has a past medical history of Testosterone deficiency. He is a patient of Dr. Elease Hashimoto who was seen on 03/02/2017 for cat bite to right index finger. During this time he was started on Augmentin 875 BID and had his Tdap updated.   Today in the office he reports that he took at the antibiotics as directed and his wound improved. Over the weekend he noticed pain in the joint along with swelling around the DIP joint. He denies any redness or warmth. He also reports limited ROM of left index finger over the weekend.   Review of Systems See HPI   Past Medical History:  Diagnosis Date  . Testosterone deficiency     Social History   Social History  . Marital status: Married    Spouse name: N/A  . Number of children: N/A  . Years of education: N/A   Occupational History  . Not on file.   Social History Main Topics  . Smoking status: Never Smoker  . Smokeless tobacco: Never Used  . Alcohol use Yes  . Drug use: No  . Sexual activity: Not on file   Other Topics Concern  . Not on file   Social History Narrative  . No narrative on file    Past Surgical History:  Procedure Laterality Date  . inguinal herniorrhapy      Family History  Problem Relation Age of Onset  . Cancer Other        breast  . Diabetes Other   . Hypertension Other     No Known Allergies  Current Outpatient Prescriptions on File Prior to Visit  Medication Sig Dispense Refill  . NEEDLE, DISP, 25 G 25G X 5/8" MISC Inject Testosterone 14ml every 14 days as directed. 24 each 1  . SYRINGE-NEEDLE, DISP, 3 ML 22G X 1-1/2" 3 ML MISC Inject 58ml every 14 days 100 each 0  . testosterone cypionate (DEPO-TESTOSTERONE) 100 MG/ML injection Inject 1 mL (100 mg total) into the muscle every 14 (fourteen) days. For IM use only 10 mL 0   No current facility-administered medications on file prior  to visit.     BP 134/90 (BP Location: Right Arm)   Temp 98.5 F (36.9 C) (Oral)   Wt 257 lb (116.6 kg)   BMI 35.84 kg/m       Objective:   Physical Exam  Constitutional: He is oriented to person, place, and time. He appears well-developed and well-nourished. No distress.  Musculoskeletal: He exhibits edema and tenderness.  Neurological: He is alert and oriented to person, place, and time.  Skin: Skin is warm and dry. No rash noted. He is not diaphoretic. No erythema. No pallor.  Does have some  mild swelling around the distal half of the finger and DIP joint. Limited ROM. No erythema or warmness noted.    Psychiatric: He has a normal mood and affect. His behavior is normal. Judgment and thought content normal.  Nursing note and vitals reviewed.      Assessment & Plan:  1. Cat bite of index finger, subsequent encounter - Will get X ray due to joint involvement. Possibly start another course of ABX. Consider sending to orthopedics  - DG Hand Complete Left; Future  Dorothyann Peng, NP

## 2017-04-19 ENCOUNTER — Ambulatory Visit (INDEPENDENT_AMBULATORY_CARE_PROVIDER_SITE_OTHER): Payer: BLUE CROSS/BLUE SHIELD | Admitting: Family Medicine

## 2017-04-19 ENCOUNTER — Encounter: Payer: Self-pay | Admitting: Family Medicine

## 2017-04-19 VITALS — BP 108/78 | HR 110 | Temp 99.0°F | Wt 254.8 lb

## 2017-04-19 DIAGNOSIS — W5501XD Bitten by cat, subsequent encounter: Secondary | ICD-10-CM

## 2017-04-19 DIAGNOSIS — S61258D Open bite of other finger without damage to nail, subsequent encounter: Secondary | ICD-10-CM

## 2017-04-19 NOTE — Progress Notes (Signed)
Subjective:     Patient ID: Guy Mcgee, male   DOB: Jul 04, 1956, 61 y.o.   MRN: [id number]  HPI   Patient seen for follow-up regarding recent swelling and infection left index finger. He had Might from their cat which is been fully vaccinated on July 31. He was treated with Augmentin and swelling and redness went down tremendously. He then came back after having some recurrent swelling was seen here on 04/08/17. X-rays revealed no acute upper mallet. Was treated with another go round of Augmentin in his fingers back down swelling now. He has no pain. No warmth. No fevers or chills.  Past Medical History:  Diagnosis Date  . Testosterone deficiency    Past Surgical History:  Procedure Laterality Date  . inguinal herniorrhapy      reports that he has never smoked. He has never used smokeless tobacco. He reports that he drinks alcohol. He reports that he does not use drugs. family history includes Cancer in his other; Diabetes in his other; Hypertension in his other. No Known Allergies   Review of Systems  Constitutional: Negative for chills and fever.       Objective:   Physical Exam  Constitutional: He appears well-developed and well-nourished.  Cardiovascular: Normal rate and regular rhythm.   Pulmonary/Chest: Effort normal and breath sounds normal. No respiratory distress. He has no wheezes. He has no rales.  Musculoskeletal:  Left index finger reveals good range of motion MCP, PIP, and DIP joints. No erythema. No warmth. No localized tenderness. No edema.       Assessment:     Recent Bite with cellulitis left index finger improved    Plan:     -No further antibiotics indicated at this time -Follow-up promptly for any recurrent redness, swelling, or other concern. Would set up with hand surgeon for any recurrent symptoms  Eulas Post MD [facility] at [facility]

## 2017-04-19 NOTE — Patient Instructions (Signed)
Follow up for any recurrent swelling, redness, or fever.

## 2017-05-26 ENCOUNTER — Encounter: Payer: Self-pay | Admitting: Family Medicine

## 2017-05-26 ENCOUNTER — Encounter: Payer: Self-pay | Admitting: Gastroenterology

## 2017-05-26 ENCOUNTER — Ambulatory Visit (INDEPENDENT_AMBULATORY_CARE_PROVIDER_SITE_OTHER): Payer: BLUE CROSS/BLUE SHIELD | Admitting: Family Medicine

## 2017-05-26 VITALS — BP 120/80 | HR 102 | Temp 98.5°F | Wt 258.2 lb

## 2017-05-26 DIAGNOSIS — K648 Other hemorrhoids: Secondary | ICD-10-CM | POA: Diagnosis not present

## 2017-05-26 DIAGNOSIS — K921 Melena: Secondary | ICD-10-CM

## 2017-05-26 DIAGNOSIS — Z86010 Personal history of colonic polyps: Secondary | ICD-10-CM | POA: Diagnosis not present

## 2017-05-26 NOTE — Progress Notes (Signed)
Subjective:     Patient ID: Guy Mcgee, male   DOB: 04-05-1956, 61 y.o.   MRN: [id number]  HPI Patient here with chief complaint of hematochezia. 2 days ago he went for bowel movement and noticed bright red blood in the colon and also with wiping. No pain with stools. No recent straining or constipation. No abdominal pain. No recent diarrhea. No recent appetite or weight changes. No episodes of bleeding since them.  Patient had colonoscopy 2010 was some mild diverticulosis in the sigmoid colon and 2 polyps in the sigmoid colon- one was adenomatous and one was hyperplastic. Was recommended he get follow-up colonoscopy in 7 years which would've been last year.  Past Medical History:  Diagnosis Date  . Testosterone deficiency    Past Surgical History:  Procedure Laterality Date  . inguinal herniorrhapy      reports that he has never smoked. He has never used smokeless tobacco. He reports that he drinks alcohol. He reports that he does not use drugs. family history includes Cancer in his other; Diabetes in his other; Hypertension in his other. No Known Allergies   Review of Systems  Constitutional: Negative for appetite change and unexpected weight change.  Respiratory: Negative for shortness of breath.   Cardiovascular: Negative for chest pain.  Gastrointestinal: Negative for abdominal pain, constipation, diarrhea, nausea, rectal pain and vomiting.  Neurological: Negative for dizziness.       Objective:   Physical Exam  Constitutional: He appears well-developed and well-nourished.  Cardiovascular: Normal rate and regular rhythm.   Pulmonary/Chest: Effort normal and breath sounds normal. No respiratory distress. He has no wheezes. He has no rales.  Abdominal: Soft. There is no tenderness.  Umbilical hernia which is soft and nontender  Genitourinary:  Genitourinary Comments: Anorectal exam reveals no significant external hemorrhoids. No visible anal fissure. Digital exam no  rectal mass. Hemoccult negative. Anoscopy reveals internal hemorrhoids but no active bleeding.       Assessment:     Hematochezia. Episode 2 days ago. Exam today reveals internal hemorrhoids but no active bleeding. He is overdue for repeat colonoscopy with prior history of colon polyps on colonoscopy 2010    Plan:     -handout on hemorrhoids given -Increase fluids and fiber to help reduce constipation -Set up GI referral for repeat colonoscopy  Eulas Post MD [facility]

## 2017-05-26 NOTE — Patient Instructions (Signed)
Hemorrhoids Hemorrhoids are swollen veins in and around the rectum or anus. There are two types of hemorrhoids:  Internal hemorrhoids. These occur in the veins that are just inside the rectum. They may poke through to the outside and become irritated and painful.  External hemorrhoids. These occur in the veins that are outside of the anus and can be felt as a painful swelling or hard lump near the anus.  Most hemorrhoids do not cause serious problems, and they can be managed with home treatments such as diet and lifestyle changes. If home treatments do not help your symptoms, procedures can be done to shrink or remove the hemorrhoids. What are the causes? This condition is caused by increased pressure in the anal area. This pressure may result from various things, including:  Constipation.  Straining to have a bowel movement.  Diarrhea.  Pregnancy.  Obesity.  Sitting for long periods of time.  Heavy lifting or other activity that causes you to strain.  Anal sex.  What are the signs or symptoms? Symptoms of this condition include:  Pain.  Anal itching or irritation.  Rectal bleeding.  Leakage of stool (feces).  Anal swelling.  One or more lumps around the anus.  How is this diagnosed? This condition can often be diagnosed through a visual exam. Other exams or tests may also be done, such as:  Examination of the rectal area with a gloved hand (digital rectal exam).  Examination of the anal canal using a small tube (anoscope).  A blood test, if you have lost a significant amount of blood.  A test to look inside the colon (sigmoidoscopy or colonoscopy).  How is this treated? This condition can usually be treated at home. However, various procedures may be done if dietary changes, lifestyle changes, and other home treatments do not help your symptoms. These procedures can help make the hemorrhoids smaller or remove them completely. Some of these procedures involve  surgery, and others do not. Common procedures include:  Rubber band ligation. Rubber bands are placed at the base of the hemorrhoids to cut off the blood supply to them.  Sclerotherapy. Medicine is injected into the hemorrhoids to shrink them.  Infrared coagulation. A type of light energy is used to get rid of the hemorrhoids.  Hemorrhoidectomy surgery. The hemorrhoids are surgically removed, and the veins that supply them are tied off.  Stapled hemorrhoidopexy surgery. A circular stapling device is used to remove the hemorrhoids and use staples to cut off the blood supply to them.  Follow these instructions at home: Eating and drinking  Eat foods that have a lot of fiber in them, such as whole grains, beans, nuts, fruits, and vegetables. Ask your health care provider about taking products that have added fiber (fiber supplements).  Drink enough fluid to keep your urine clear or pale yellow. Managing pain and swelling  Take warm sitz baths for 20 minutes, 3-4 times a day to ease pain and discomfort.  If directed, apply ice to the affected area. Using ice packs between sitz baths may be helpful. ? Put ice in a plastic bag. ? Place a towel between your skin and the bag. ? Leave the ice on for 20 minutes, 2-3 times a day. General instructions  Take over-the-counter and prescription medicines only as told by your health care provider.  Use medicated creams or suppositories as told.  Exercise regularly.  Go to the bathroom when you have the urge to have a bowel movement. Do not wait.    Avoid straining to have bowel movements.  Keep the anal area dry and clean. Use wet toilet paper or moist towelettes after a bowel movement.  Do not sit on the toilet for long periods of time. This increases blood pooling and pain. Contact a health care provider if:  You have increasing pain and swelling that are not controlled by treatment or medicine.  You have uncontrolled bleeding.  You  have difficulty having a bowel movement, or you are unable to have a bowel movement.  You have pain or inflammation outside the area of the hemorrhoids. This information is not intended to replace advice given to you by your health care provider. Make sure you discuss any questions you have with your health care provider. Document Released: 07/17/2000 Document Revised: 12/18/2015 Document Reviewed: 04/03/2015 Elsevier Interactive Patient Education  2017 [place].  We will set up GI referral for repeat colonoscopy.

## 2017-06-30 ENCOUNTER — Other Ambulatory Visit: Payer: Self-pay

## 2017-06-30 ENCOUNTER — Ambulatory Visit (AMBULATORY_SURGERY_CENTER): Payer: Self-pay | Admitting: *Deleted

## 2017-06-30 VITALS — Ht 71.5 in | Wt 265.0 lb

## 2017-06-30 DIAGNOSIS — Z86010 Personal history of colonic polyps: Secondary | ICD-10-CM

## 2017-06-30 NOTE — Progress Notes (Signed)
No egg or soy allergy known to patient  No issues with past sedation with any surgeries  or procedures, no intubation problems  No diet pills per patient No home 02 use per patient  No blood thinners per patient  Pt denies issues with constipation  No A fib or A flutter  EMMI video sent to pt's e mail - pt declined  Medication Samples have been provided to the patient.  Drug name: plenvu           Qty: 1 kit  LOT: [id number]  Exp.Date: 01-2019  Dosing instructions: as directed   The patient has been instructed regarding the correct time, dose, and frequency of taking this medication, including desired effects and most common side effects.   Guy Mcgee 9:13 AM 06/30/2017

## 2017-07-01 ENCOUNTER — Other Ambulatory Visit (INDEPENDENT_AMBULATORY_CARE_PROVIDER_SITE_OTHER): Payer: BLUE CROSS/BLUE SHIELD

## 2017-07-01 DIAGNOSIS — D751 Secondary polycythemia: Secondary | ICD-10-CM

## 2017-07-01 LAB — CBC WITH DIFFERENTIAL/PLATELET
BASOS ABS: 0 10*3/uL (ref 0.0–0.1)
BASOS PCT: 0.3 % (ref 0.0–3.0)
EOS ABS: 0.2 10*3/uL (ref 0.0–0.7)
Eosinophils Relative: 2.4 % (ref 0.0–5.0)
HEMATOCRIT: 53.3 % — AB (ref 39.0–52.0)
HEMOGLOBIN: 17.4 g/dL — AB (ref 13.0–17.0)
LYMPHS PCT: 26.4 % (ref 12.0–46.0)
Lymphs Abs: 1.7 10*3/uL (ref 0.7–4.0)
MCHC: 32.6 g/dL (ref 30.0–36.0)
MCV: 95.6 fl (ref 78.0–100.0)
Monocytes Absolute: 0.7 10*3/uL (ref 0.1–1.0)
Monocytes Relative: 11.1 % (ref 3.0–12.0)
Neutro Abs: 3.8 10*3/uL (ref 1.4–7.7)
Neutrophils Relative %: 59.8 % (ref 43.0–77.0)
Platelets: 210 10*3/uL (ref 150.0–400.0)
RBC: 5.58 Mil/uL (ref 4.22–5.81)
RDW: 13.6 % (ref 11.5–15.5)
WBC: 6.4 10*3/uL (ref 4.0–10.5)

## 2017-07-01 LAB — TESTOSTERONE: Testosterone: 285.61 ng/dL — ABNORMAL LOW (ref 300.00–890.00)

## 2017-07-05 ENCOUNTER — Other Ambulatory Visit: Payer: Self-pay | Admitting: *Deleted

## 2017-07-07 ENCOUNTER — Encounter: Payer: Self-pay | Admitting: Gastroenterology

## 2017-07-13 ENCOUNTER — Encounter: Payer: BLUE CROSS/BLUE SHIELD | Admitting: Gastroenterology

## 2017-07-26 ENCOUNTER — Ambulatory Visit: Payer: Self-pay

## 2017-07-26 ENCOUNTER — Ambulatory Visit: Payer: BLUE CROSS/BLUE SHIELD | Admitting: Family Medicine

## 2017-07-26 ENCOUNTER — Encounter: Payer: Self-pay | Admitting: Family Medicine

## 2017-07-26 VITALS — BP 140/100 | HR 116 | Temp 98.8°F | Wt 272.8 lb

## 2017-07-26 DIAGNOSIS — L02232 Carbuncle of back [any part, except buttock]: Secondary | ICD-10-CM | POA: Diagnosis not present

## 2017-07-26 DIAGNOSIS — L02222 Furuncle of back [any part, except buttock]: Secondary | ICD-10-CM

## 2017-07-26 MED ORDER — DOXYCYCLINE HYCLATE 100 MG PO CAPS: 100.0000 mg | ORAL_CAPSULE | Freq: Two times a day (BID) | ORAL | 0 refills | Status: AC

## 2017-07-26 NOTE — Progress Notes (Signed)
   Subjective:    Patient ID: Guy Mcgee, male    DOB: January 29, 1956, 61 y.o.   MRN: [id number]  HPI Here for several days of a painful lump on the back. He has had a cyst here for at least a year bit it has never bothered him until lately. No fever.    Review of Systems  Constitutional: Negative.   Respiratory: Negative.   Cardiovascular: Negative.        Objective:   Physical Exam  Constitutional: He appears well-developed and well-nourished.  Cardiovascular: Normal rate, regular rhythm, normal heart sounds and intact distal pulses.  Pulmonary/Chest: Effort normal and breath sounds normal. No respiratory distress. He has no wheezes. He has no rales.  Skin:  Large tender boil on the left upper back           Assessment & Plan:  Boil, this was lanced with a scalpel and a large amount of purulent material was expressed. Dressed with a Bandaid. Cover with Doxycycline. Wash with hot water and soap in the shower daily. Recheck prn.  Alysia Penna, MD

## 2017-07-26 NOTE — Telephone Encounter (Signed)
Noted  

## 2017-07-26 NOTE — Telephone Encounter (Signed)
Patient called in with c/o "swollen, red cyst on my upper back." He denies fever, drainage. He says he noticed it on Friday, 07/23/17 and it got worse over the weekend. It is painful to touch. He said it looks like a "massive pimple that needs to be popped." See assessment notes for further details. Per protocol, appointment made for today, care advice given, patient verbalized understanding.    Reason for Disposition . [1] Swelling is painful to touch AND [2] fever  Answer Assessment - Initial Assessment Questions 1. APPEARANCE of SWELLING: "What does it look like?" (e.g., lymph node, insect bite, mole)     Massive pimple, redness, no warmth when felt 2. SIZE: "How large is the swelling?" (inches, cm or compare to coins)     Half dollar size 3. LOCATION: "Where is the swelling located?"    Upper back, left shoulder 4. ONSET: "When did the swelling start?"     Sometime on Friday, 07/23/17 that I noticed 5. PAIN: "Is it painful?" If so, ask: "How much?"     Yes painful-Mild pain 6. ITCH: "Does it itch?" If so, ask: "How much?"     Denies itching 7. CAUSE: "What do you think caused the swelling?"     Unknown, but maybe from the cyst I've had 8. OTHER SYMPTOMS: "Do you have any other symptoms?" (e.g., fever)     Denies fever, no drainage noted  Protocols used: SKIN LUMP OR LOCALIZED SWELLING-A-AH

## 2017-08-05 ENCOUNTER — Ambulatory Visit: Payer: BLUE CROSS/BLUE SHIELD | Admitting: Family Medicine

## 2017-08-12 ENCOUNTER — Other Ambulatory Visit: Payer: Self-pay

## 2017-08-12 ENCOUNTER — Ambulatory Visit (AMBULATORY_SURGERY_CENTER): Payer: BLUE CROSS/BLUE SHIELD | Admitting: Gastroenterology

## 2017-08-12 ENCOUNTER — Encounter: Payer: Self-pay | Admitting: Gastroenterology

## 2017-08-12 VITALS — BP 116/77 | HR 96 | Temp 98.2°F | Resp 22 | Ht 71.0 in | Wt 272.0 lb

## 2017-08-12 DIAGNOSIS — K635 Polyp of colon: Secondary | ICD-10-CM

## 2017-08-12 DIAGNOSIS — D126 Benign neoplasm of colon, unspecified: Secondary | ICD-10-CM | POA: Diagnosis not present

## 2017-08-12 DIAGNOSIS — Z86010 Personal history of colonic polyps: Secondary | ICD-10-CM

## 2017-08-12 DIAGNOSIS — D124 Benign neoplasm of descending colon: Secondary | ICD-10-CM

## 2017-08-12 DIAGNOSIS — D120 Benign neoplasm of cecum: Secondary | ICD-10-CM

## 2017-08-12 MED ORDER — SODIUM CHLORIDE 0.9 % IV SOLN: 500.0000 mL | Freq: Once | INTRAVENOUS | Status: AC

## 2017-08-12 NOTE — Progress Notes (Signed)
Called to room to assist during endoscopic procedure.  Patient ID and intended procedure confirmed with present staff. Received instructions for my participation in the procedure from the performing physician.  

## 2017-08-12 NOTE — Progress Notes (Signed)
Pt states no allergy to soy or eggs Pt states he currently has an umbilical hernia, currently being watched by Dr. Elease Hashimoto.

## 2017-08-12 NOTE — Patient Instructions (Signed)
YOU HAD AN ENDOSCOPIC PROCEDURE TODAY AT [facility]:   Refer to the procedure report that was given to you for any specific questions about what was found during the examination.  If the procedure report does not answer your questions, please call your gastroenterologist to clarify.  If you requested that your care partner not be given the details of your procedure findings, then the procedure report has been included in a sealed envelope for you to review at your convenience later.  YOU SHOULD EXPECT: Some feelings of bloating in the abdomen. Passage of more gas than usual.  Walking can help get rid of the air that was put into your GI tract during the procedure and reduce the bloating. If you had a lower endoscopy (such as a colonoscopy or flexible sigmoidoscopy) you may notice spotting of blood in your stool or on the toilet paper. If you underwent a bowel prep for your procedure, you may not have a normal bowel movement for a few days.  Please Note:  You might notice some irritation and congestion in your nose or some drainage.  This is from the oxygen used during your procedure.  There is no need for concern and it should clear up in a day or so.  SYMPTOMS TO REPORT IMMEDIATELY:   Following lower endoscopy (colonoscopy or flexible sigmoidoscopy):  Excessive amounts of blood in the stool  Significant tenderness or worsening of abdominal pains  Swelling of the abdomen that is new, acute  Fever of 100F or higher  Please see handouts on Polyps and Diverticulosis  For urgent or emergent issues, a gastroenterologist can be reached at any hour by calling [phone].   DIET:  We do recommend a small meal at first, but then you may proceed to your regular diet.  Drink plenty of fluids but you should avoid alcoholic beverages for 24 hours.  ACTIVITY:  You should plan to take it easy for the rest of today and you should NOT DRIVE or use heavy machinery until tomorrow (because  of the sedation medicines used during the test).    FOLLOW UP: Our staff will call the number listed on your records the next business day following your procedure to check on you and address any questions or concerns that you may have regarding the information given to you following your procedure. If we do not reach you, we will leave a message.  However, if you are feeling well and you are not experiencing any problems, there is no need to return our call.  We will assume that you have returned to your regular daily activities without incident.  If any biopsies were taken you will be contacted by phone or by letter within the next 1-3 weeks.  Please call us at [phone] if you have not heard about the biopsies in 3 weeks.    SIGNATURES/CONFIDENTIALITY: You and/or your care partner have signed paperwork which will be entered into your electronic medical record.  These signatures attest to the fact that that the information above on your After Visit Summary has been reviewed and is understood.  Full responsibility of the confidentiality of this discharge information lies with you and/or your care-partner.  Thank you for letting us take care of your healthcare needs today.

## 2017-08-12 NOTE — Progress Notes (Signed)
To PACU, VSS. Report to [email or web address] 

## 2017-08-12 NOTE — Op Note (Signed)
[facility] Patient Name: Guy Mcgee Procedure Date: 08/12/2017 4:01 PM MRN: [id number] Endoscopist: Mallie Mussel L. Loletha Carrow , MD Age: 62 Referring MD:  Date of Birth: [birth year] Gender: Male Account #: [id number] Procedure:                Colonoscopy Indications:              Surveillance: Personal history of adenomatous                            polyps on last colonoscopy > 5 years ago (< 28mm                            TA; 06/2009) Medicines:                Monitored Anesthesia Care Procedure:                Pre-Anesthesia Assessment:                           - Prior to the procedure, a History and Physical                            was performed, and patient medications and                            allergies were reviewed. The patient's tolerance of                            previous anesthesia was also reviewed. The risks                            and benefits of the procedure and the sedation                            options and risks were discussed with the patient.                            All questions were answered, and informed consent                            was obtained. Prior Anticoagulants: The patient has                            taken no previous anticoagulant or antiplatelet                            agents. ASA Grade Assessment: III - A patient with                            severe systemic disease. After reviewing the risks                            and benefits, the patient was deemed in  satisfactory condition to undergo the procedure.                           After obtaining informed consent, the colonoscope                            was passed under direct vision. Throughout the                            procedure, the patient's blood pressure, pulse, and                            oxygen saturations were monitored continuously. The                            Colonoscope was introduced through the anus and                           advanced to the the cecum, identified by                            appendiceal orifice and ileocecal valve. The                            colonoscopy was performed without difficulty. The                            patient tolerated the procedure well. The quality                            of the bowel preparation was excellent. The                            ileocecal valve, appendiceal orifice, and rectum                            were photographed. The quality of the bowel                            preparation was evaluated using the BBPS [facility]                            Bowel Preparation Scale) with scores of: Right                            Colon = 3, Transverse Colon = 3 and Left Colon = 3                            (entire mucosa seen well with no residual staining,                            small fragments of stool or opaque liquid). The  total BBPS score equals 9. The bowel preparation                            used was Plenvu. Scope In: 4:14:55 PM Scope Out: 4:27:20 PM Scope Withdrawal Time: 0 hours 9 minutes 40 seconds  Total Procedure Duration: 0 hours 12 minutes 25 seconds  Findings:                 The perianal and digital rectal examinations were                            normal.                           A 2 mm polyp was found in the cecum. The polyp was                            sessile. The polyp was removed with a cold biopsy                            forceps. Resection and retrieval were complete.                           A 5 mm polyp was found in the descending colon. The                            polyp was semi-pedunculated. The polyp was removed                            with a cold snare. Resection and retrieval were                            complete.                           Many diverticula were found in the left colon.                           The exam was otherwise without abnormality on                             direct and retroflexion views. Complications:            No immediate complications. Estimated Blood Loss:     Estimated blood loss was minimal. Impression:               - One 2 mm polyp in the cecum, removed with a cold                            biopsy forceps. Resected and retrieved.                           - One 5 mm polyp in the descending colon, removed  with a cold snare. Resected and retrieved.                           - Diverticulosis in the left colon.                           - The examination was otherwise normal on direct                            and retroflexion views. Recommendation:           - Patient has a contact number available for                            emergencies. The signs and symptoms of potential                            delayed complications were discussed with the                            patient. Return to normal activities tomorrow.                            Written discharge instructions were provided to the                            patient.                           - Resume previous diet.                           - Continue present medications.                           - Await pathology results.                           - Repeat colonoscopy is recommended for                            surveillance. The colonoscopy date will be                            determined after pathology results from today's                            exam become available for review. Henry L. Loletha Carrow, MD 08/12/2017 4:33:21 PM This report has been signed electronically.

## 2017-08-13 ENCOUNTER — Telehealth: Payer: Self-pay

## 2017-08-13 NOTE — Telephone Encounter (Signed)
  Follow up Call-  Call back number 08/12/2017  Post procedure Call Back phone  # [phone]  Permission to leave phone message Yes  Some recent data might be hidden     Patient questions:  Do you have a fever, pain , or abdominal swelling? No. Pain Score  0 *  Have you tolerated food without any problems? Yes.    Have you been able to return to your normal activities? Yes.    Do you have any questions about your discharge instructions: Diet   No. Medications  No. Follow up visit  No.  Do you have questions or concerns about your Care? No.  Actions: * If pain score is 4 or above: No action needed, pain <4.

## 2017-08-19 ENCOUNTER — Encounter: Payer: Self-pay | Admitting: Gastroenterology

## 2018-02-15 ENCOUNTER — Ambulatory Visit: Payer: BLUE CROSS/BLUE SHIELD | Admitting: Family Medicine

## 2018-02-15 ENCOUNTER — Ambulatory Visit: Payer: Self-pay | Admitting: *Deleted

## 2018-02-15 ENCOUNTER — Encounter: Payer: Self-pay | Admitting: Family Medicine

## 2018-02-15 VITALS — BP 120/80 | HR 106 | Temp 98.5°F | Wt 247.3 lb

## 2018-02-15 DIAGNOSIS — R7989 Other specified abnormal findings of blood chemistry: Secondary | ICD-10-CM

## 2018-02-15 DIAGNOSIS — R1030 Lower abdominal pain, unspecified: Secondary | ICD-10-CM

## 2018-02-15 LAB — POCT URINALYSIS DIPSTICK
BILIRUBIN UA: NEGATIVE
GLUCOSE UA: NEGATIVE
LEUKOCYTES UA: NEGATIVE
Nitrite, UA: NEGATIVE
PROTEIN UA: NEGATIVE
Spec Grav, UA: >=1.03 — AB (ref 1.010–1.025)
Urobilinogen, UA: 0.2 E.U./dL
pH, UA: 6 (ref 5.0–8.0)

## 2018-02-15 MED ORDER — METRONIDAZOLE 500 MG PO TABS: 500.0000 mg | ORAL_TABLET | Freq: Three times a day (TID) | ORAL | 0 refills | Status: AC

## 2018-02-15 MED ORDER — CIPROFLOXACIN HCL 500 MG PO TABS: 500.0000 mg | ORAL_TABLET | Freq: Two times a day (BID) | ORAL | 0 refills | Status: AC

## 2018-02-15 NOTE — Telephone Encounter (Signed)
Please advise 

## 2018-02-15 NOTE — Telephone Encounter (Signed)
Patient is calling to report that he had san episode of severe abdominal pain last week that traveled from R abdomen into groin and testicle. Patient had thought he had strained a muscle. He states he  Is not having the pain any more- but still feels residual twinges in the testicle. Appointment scheduled. Reason for Disposition . [1] Pain in the scrotum or testicle AND [2] present > 1 hour  Answer Assessment - Initial Assessment Questions 1. LOCATION: "Where does it hurt?"      R abdominal started Th- subsided mostly 2. RADIATION: "Does the pain shoot anywhere else?" (e.g., chest, back)     No pain now- did not radiate 3. ONSET: "When did the pain begin?" (Minutes, hours or days ago)      Sunday- pain got better - patient feels residual pain in R testicle 4. SUDDEN: "Gradual or sudden onset?"     Gradual- then became more intense 5. PATTERN "Does the pain come and go, or is it constant?"    - If constant: "Is it getting better, staying the same, or worsening?"      (Note: Constant means the pain never goes away completely; most serious pain is constant and it progresses)     - If intermittent: "How long does it last?" "Do you have pain now?"     (Note: Intermittent means the pain goes away completely between bouts)     It was constant 6. SEVERITY: "How bad is the pain?"  (e.g., Scale 1-10; mild, moderate, or severe)    - MILD (1-3): doesn't interfere with normal activities, abdomen soft and not tender to touch     - MODERATE (4-7): interferes with normal activities or awakens from sleep, tender to touch     - SEVERE (8-10): excruciating pain, doubled over, unable to do any normal activities       moderate 7. RECURRENT SYMPTOM: "Have you ever had this type of abdominal pain before?" If so, ask: "When was the last time?" and "What happened that time?"      no 8. CAUSE: "What do you think is causing the abdominal pain?"     Diverticulitis vs pulled/strianed muscle 9. RELIEVING/AGGRAVATING  FACTORS: "What makes it better or worse?" (e.g., movement, antacids, bowel movement)     No- felt mostly when standing and moving 10. OTHER SYMPTOMS: "Has there been any vomiting, diarrhea, constipation, or urine problems?"       Foul smell to urine couple weeks ago- nothing else  Protocols used: ABDOMINAL PAIN - MALE-A-AH

## 2018-02-15 NOTE — Patient Instructions (Signed)
Diverticulitis °Diverticulitis is infection or inflammation of small pouches (diverticula) in the colon that form due to a condition called diverticulosis. Diverticula can trap stool (feces) and bacteria, causing infection and inflammation. °Diverticulitis may cause severe stomach pain and diarrhea. It may lead to tissue damage in the colon that causes bleeding. The diverticula may also burst (rupture) and cause infected stool to enter other areas of the abdomen. °Complications of diverticulitis can include: °· Bleeding. °· Severe infection. °· Severe pain. °· Rupture (perforation) of the colon. °· Blockage (obstruction) of the colon. ° °What are the causes? °This condition is caused by stool becoming trapped in the diverticula, which allows bacteria to grow in the diverticula. This leads to inflammation and infection. °What increases the risk? °You are more likely to develop this condition if: °· You have diverticulosis. The risk for diverticulosis increases if: °? You are overweight or obese. °? You use tobacco products. °? You do not get enough exercise. °· You eat a diet that does not include enough fiber. High-fiber foods include fruits, vegetables, beans, nuts, and whole grains. ° °What are the signs or symptoms? °Symptoms of this condition may include: °· Pain and tenderness in the abdomen. The pain is normally located on the left side of the abdomen, but it may occur in other areas. °· Fever and chills. °· Bloating. °· Cramping. °· Nausea. °· Vomiting. °· Changes in bowel routines. °· Blood in your stool. ° °How is this diagnosed? °This condition is diagnosed based on: °· Your medical history. °· A physical exam. °· Tests to make sure there is nothing else causing your condition. These tests may include: °? Blood tests. °? Urine tests. °? Imaging tests of the abdomen, including X-rays, ultrasounds, MRIs, or CT scans. ° °How is this treated? °Most cases of this condition are mild and can be treated at home.  Treatment may include: °· Taking over-the-counter pain medicines. °· Following a clear liquid diet. °· Taking antibiotic medicines by mouth. °· Rest. ° °More severe cases may need to be treated at a hospital. Treatment may include: °· Not eating or drinking. °· Taking prescription pain medicine. °· Receiving antibiotic medicines through an IV tube. °· Receiving fluids and nutrition through an IV tube. °· Surgery. ° °When your condition is under control, your health care provider may recommend that you have a colonoscopy. This is an exam to look at the entire large intestine. During the exam, a lubricated, bendable tube is inserted into the anus and then passed into the rectum, colon, and other parts of the large intestine. A colonoscopy can show how severe your diverticula are and whether something else may be causing your symptoms. °Follow these instructions at home: °Medicines °· Take over-the-counter and prescription medicines only as told by your health care provider. These include fiber supplements, probiotics, and stool softeners. °· If you were prescribed an antibiotic medicine, take it as told by your health care provider. Do not stop taking the antibiotic even if you start to feel better. °· Do not drive or use heavy machinery while taking prescription pain medicine. °General instructions °· Follow a full liquid diet or another diet as directed by your health care provider. After your symptoms improve, your health care provider may tell you to change your diet. He or she may recommend that you eat a diet that contains at least 25 g (25 grams) of fiber daily. Fiber makes it easier to pass stool. Healthy sources of fiber include: °? Berries. One cup   contains 4-8 grams of fiber. °? Beans or lentils. One half cup contains 5-8 grams of fiber. °? Green vegetables. One cup contains 4 grams of fiber. °· Exercise for at least 30 minutes, 3 times each week. You should exercise hard enough to raise your heart rate and  break a sweat. °· Keep all follow-up visits as told by your health care provider. This is important. You may need a colonoscopy. °Contact a health care provider if: °· Your pain does not improve. °· You have a hard time drinking or eating food. °· Your bowel movements do not return to normal. °Get help right away if: °· Your pain gets worse. °· Your symptoms do not get better with treatment. °· Your symptoms suddenly get worse. °· You have a fever. °· You vomit more than one time. °· You have stools that are bloody, black, or tarry. °Summary °· Diverticulitis is infection or inflammation of small pouches (diverticula) in the colon that form due to a condition called diverticulosis. Diverticula can trap stool (feces) and bacteria, causing infection and inflammation. °· You are at higher risk for this condition if you have diverticulosis and you eat a diet that does not include enough fiber. °· Most cases of this condition are mild and can be treated at home. More severe cases may need to be treated at a hospital. °· When your condition is under control, your health care provider may recommend that you have an exam called a colonoscopy. This exam can show how severe your diverticula are and whether something else may be causing your symptoms. °This information is not intended to replace advice given to you by your health care provider. Make sure you discuss any questions you have with your health care provider. °Document Released: 04/29/2005 Document Revised: 08/22/2016 Document Reviewed: 08/22/2016 °Elsevier Interactive Patient Education © 2018 [employer] ° °

## 2018-02-15 NOTE — Progress Notes (Signed)
  Subjective:     Patient ID: Guy Mcgee, male   DOB: 12/29/1955, 62 y.o.   MRN: [id number]  HPI Patient is seen with left lower quadrant abdominal pain with onset around last Thursday. He tried to lift a bush-hog week ago and initially thought he may have strained something butt abdominal pain did not start until 3 or 4 days later. He describes a dull achy pain which is mild to moderate and relatively constant severity. No exacerbating or alleviating factors. Some radiation toward the testicle region.  He had colonoscopy January of this year which showed some diverticulosis in the descending colon. No history of diverticulitis. Denies any fevers or chills. No urinary symptoms. No flank pain. No nausea or vomiting. No stool changes. He has some chronic intermittent constipation but no recent changes  History of low testosterone. 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 He would like to reexplore whether he can go back on testosterone placement this time.  Past Medical History:  Diagnosis Date  . Testosterone deficiency   . Umbilical hernia [id number]   Past Surgical History:  Procedure Laterality Date  . COLONOSCOPY    . inguinal herniorrhapy    . POLYPECTOMY      reports that he has never smoked. He has never used smokeless tobacco. He reports that he drinks alcohol. He reports that he does not use drugs. family history includes Brain cancer in his mother; Cancer in his other; Colon polyps in his paternal grandfather; Diabetes in his other; Hypertension in his other; Lung cancer in his mother; Stomach cancer in his paternal grandmother. No Known Allergies   Review of Systems  Constitutional: Negative for chills and fever.  Respiratory: Negative for shortness of breath.   Cardiovascular: Negative for chest pain.  Gastrointestinal: Positive for abdominal pain. Negative for abdominal distention, blood in stool, diarrhea, nausea and vomiting.  Genitourinary: Negative for  dysuria, flank pain and hematuria.  Musculoskeletal: Negative for back pain.  Skin: Negative for rash.       Objective:   Physical Exam  Constitutional: He appears well-developed and well-nourished.  Cardiovascular: Normal rate and regular rhythm.  Pulmonary/Chest: Effort normal and breath sounds normal.  Abdominal: Normal appearance and bowel sounds are normal. He exhibits no ascites. There is no splenomegaly or hepatomegaly. A hernia is present.  Slightly tender left lower quadrant deep palpation. No guarding or rebound. No masses. He has umbilical hernia which is soft and nontender  Skin: No rash noted.       Assessment:     #1 left lower quadrant abdominal pain with onset about 5 days ago. No evidence for acute abdomen. Question acute diverticulitis. He has known history of diverticulosis. Urine dipstick unremarkable  #2 history of low testosterone. He had polycythemia on replacement. He is requesting repeat trial on testosterone replacement    Plan:     -Check CBC with differential and PSA -Start Cipro 500 mg twice a day for 10 days and Flagyl 500 mg 3 times a day for 10 days -Follow-up immediately for any increasing fever or worsening abdominal pain. CT abdomen and pelvis for any worsening symptoms -Patient will check with insurance to see if they cover AndroGel topical. If we end up going back on replacement will have to monitor his CBC very closely and consider scheduled regular periodic phlebotomy (eg blood donations)  Eulas Post MD [facility] at [facility]

## 2018-02-15 NOTE — Telephone Encounter (Signed)
Patient coming in for office visit today

## 2018-02-16 LAB — CBC WITH DIFFERENTIAL/PLATELET
BASOS ABS: 0 10*3/uL (ref 0.0–0.1)
Basophils Relative: 0.4 % (ref 0.0–3.0)
EOS ABS: 0.1 10*3/uL (ref 0.0–0.7)
Eosinophils Relative: 1.5 % (ref 0.0–5.0)
HCT: 47.8 % (ref 39.0–52.0)
Hemoglobin: 16.3 g/dL (ref 13.0–17.0)
LYMPHS ABS: 2.1 10*3/uL (ref 0.7–4.0)
Lymphocytes Relative: 27.5 % (ref 12.0–46.0)
MCHC: 34 g/dL (ref 30.0–36.0)
MCV: 90.4 fl (ref 78.0–100.0)
MONO ABS: 0.7 10*3/uL (ref 0.1–1.0)
Monocytes Relative: 9.2 % (ref 3.0–12.0)
NEUTROS ABS: 4.7 10*3/uL (ref 1.4–7.7)
NEUTROS PCT: 61.4 % (ref 43.0–77.0)
PLATELETS: 256 10*3/uL (ref 150.0–400.0)
RBC: 5.29 Mil/uL (ref 4.22–5.81)
RDW: 13.3 % (ref 11.5–15.5)
WBC: 7.6 10*3/uL (ref 4.0–10.5)

## 2018-02-16 LAB — PSA: PSA: 0.81 ng/mL (ref 0.10–4.00)

## 2018-02-17 ENCOUNTER — Telehealth: Payer: Self-pay | Admitting: Family Medicine

## 2018-02-17 NOTE — Telephone Encounter (Signed)
Copied from [place] [id number]. Topic: General - Other >> Feb 17, 2018  1:11 PM Cecelia Byars, NT wrote: Reason for CRM: Patient called and stated his insurance will cover the Testerone a generic form of the androgel  please advise [phone]

## 2018-02-18 MED ORDER — TESTOSTERONE 20.25 MG/1.25GM (1.62%) TD GEL: TRANSDERMAL | 5 refills | Status: AC

## 2018-02-18 NOTE — Telephone Encounter (Signed)
Spoke with pharmacist that they are not sure of a generic Androgel?

## 2018-02-18 NOTE — Telephone Encounter (Signed)
Let's see if they will cover Androgel 1.62% gel-one pump spray per arm once daily.  He will need 2 month office follow up and will repeat CBC at that time- along with testosterone level.

## 2018-02-18 NOTE — Telephone Encounter (Signed)
Spoke with patient and he said it is generic testosterone gel.   Okay to send in Rx?

## 2018-02-18 NOTE — Telephone Encounter (Signed)
Rx called in 

## 2018-02-25 ENCOUNTER — Telehealth: Payer: Self-pay | Admitting: *Deleted

## 2018-02-25 NOTE — Telephone Encounter (Signed)
Prior auth for Testosterone 1.62%-75G gel sent to [email or web address]-key [id number].  Testosterone results from 07/01/2017 and 08/13/2015 were faxed to [phone].

## 2018-03-07 NOTE — Telephone Encounter (Signed)
Patient is calling to know the status of his Testosterone.  If it is not approved can he go back to injections?

## 2018-03-11 NOTE — Telephone Encounter (Signed)
Fax received from [employer] stating the request for Testosterone 1.62% gel was approved.  I called [facility] and informed [name] of this and also called the pt and informed him of this.

## 2018-10-13 ENCOUNTER — Telehealth: Payer: Self-pay | Admitting: Family Medicine

## 2018-10-13 NOTE — Telephone Encounter (Signed)
See attached note.   Pt requesting Testosterone Gel refill however he is switching doctors due to insurance.  Pt of Dr. Elease Hashimoto.

## 2018-10-13 NOTE — Telephone Encounter (Signed)
Copied from [place] [phone]. Topic: Quick Communication - Rx Refill/Question >> Oct 13, 2018 11:04 AM Douglass Rivers L wrote: Medication: Testosterone 20.25 MG/1.25GM (1.62%) GEL   Pt is having to switch doctors due to insurance coverage. New practice has not received PHI from [facility]. Pt wanted to know if Dr. Elease Hashimoto will send refill until he gets in to see his new doctor.   Preferred Pharmacy (with phone number or street name): [place], [place] - [address] [phone] (Phone) [phone] (Fax)    Agent: Please be advised that RX refills may take up to 3 business days. We ask that you follow-up with your pharmacy.

## 2018-10-14 NOTE — Telephone Encounter (Signed)
Last OV 02/15/18, No future OV  Last testosterone lab 07/01/2017  Last filled 02/18/18, 1.25g with 5 refills  Please advise of OK to fill?

## 2018-10-16 NOTE — Telephone Encounter (Signed)
He needs follow up labs.  Refill once until he can get in to see new provider.

## 2018-10-18 NOTE — Telephone Encounter (Signed)
Author relayed Dr. Erick Blinks message, and pt. Stated he could not get lab drawn here d/t change in insurance, but would ask [place] [facility] where he will be getting care from Dr. Sabra Heck to have labs drawn there, and results faxed to Dr. Elease Hashimoto so order can be placed and rx filled prior to seeing Dr. Sabra Heck at end of April.

## 2019-05-01 IMAGING — DX DG HAND COMPLETE 3+V*L*
3 series · 3 of 3 positions shown · non-contrast
Comparison: None.

CLINICAL DATA: Acute left hand pain and swelling. History of cat
bite 1 month ago.

EXAM:
LEFT HAND - COMPLETE 3+ VIEW

[hand ap]
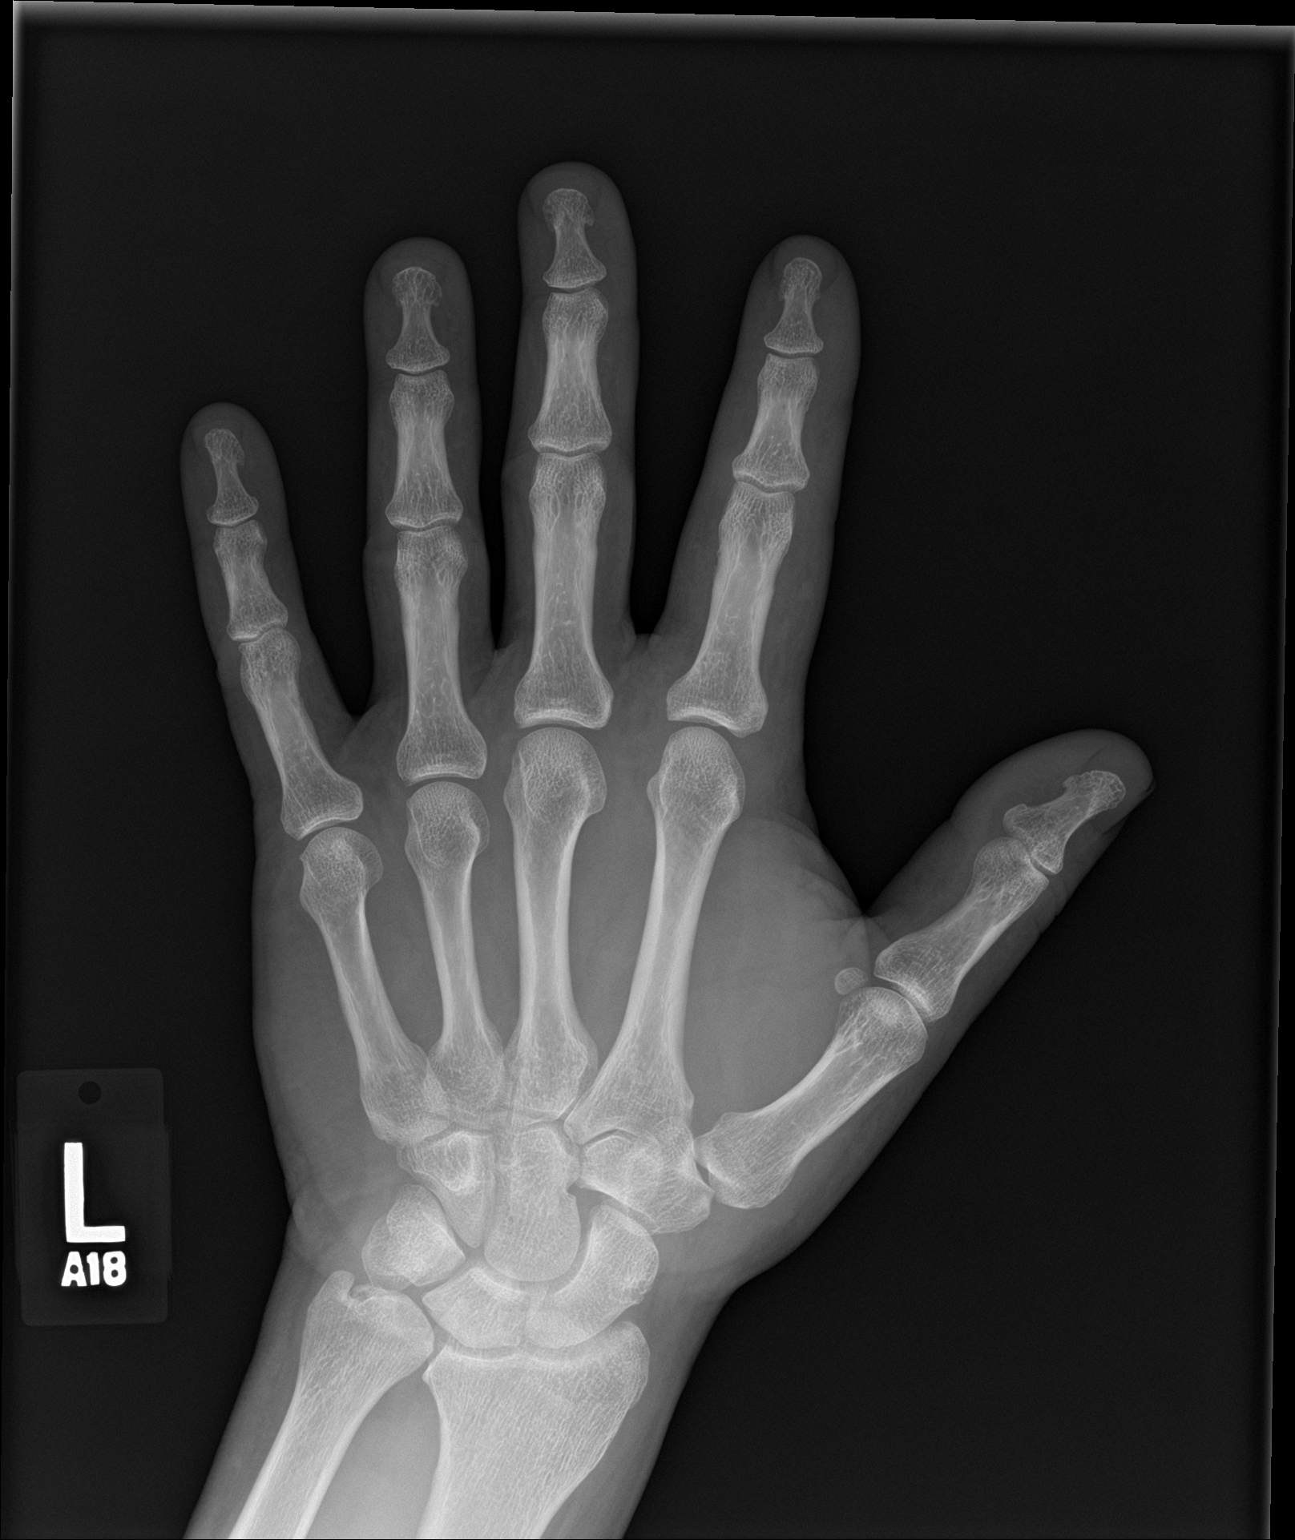

[hand obl]
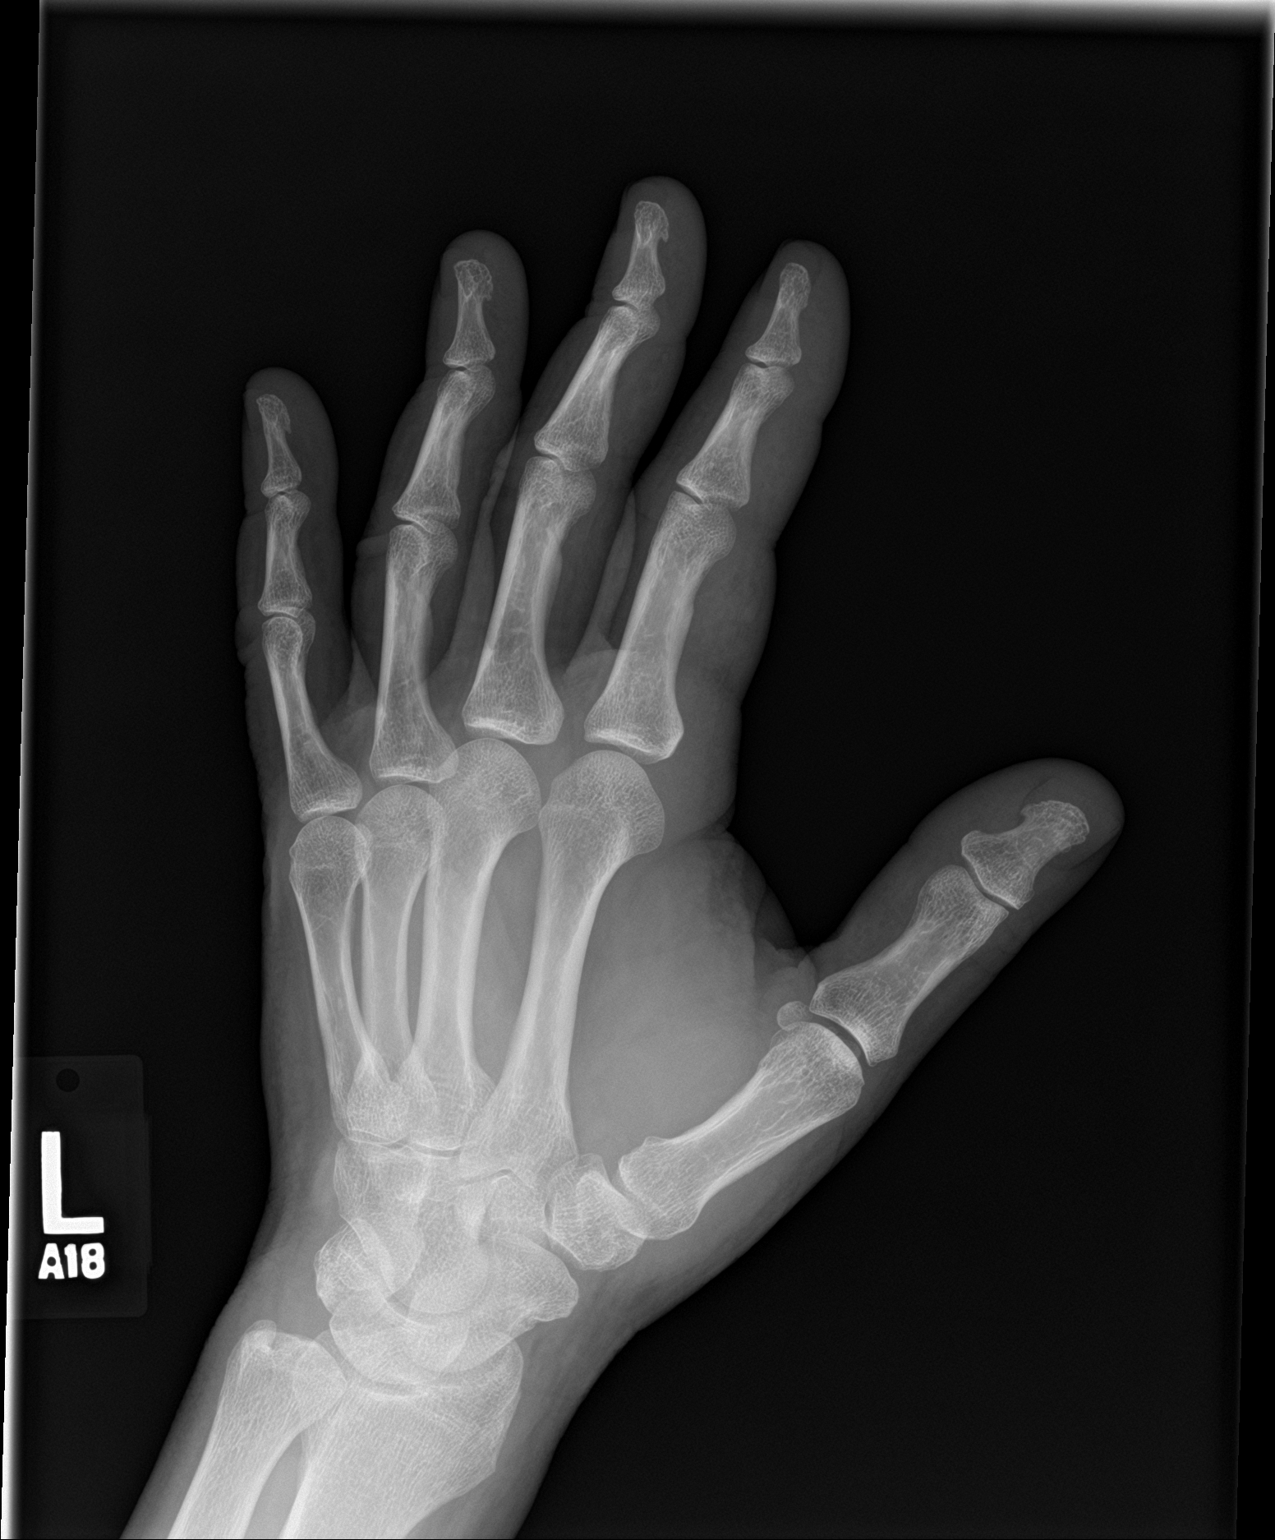

[hand lat]
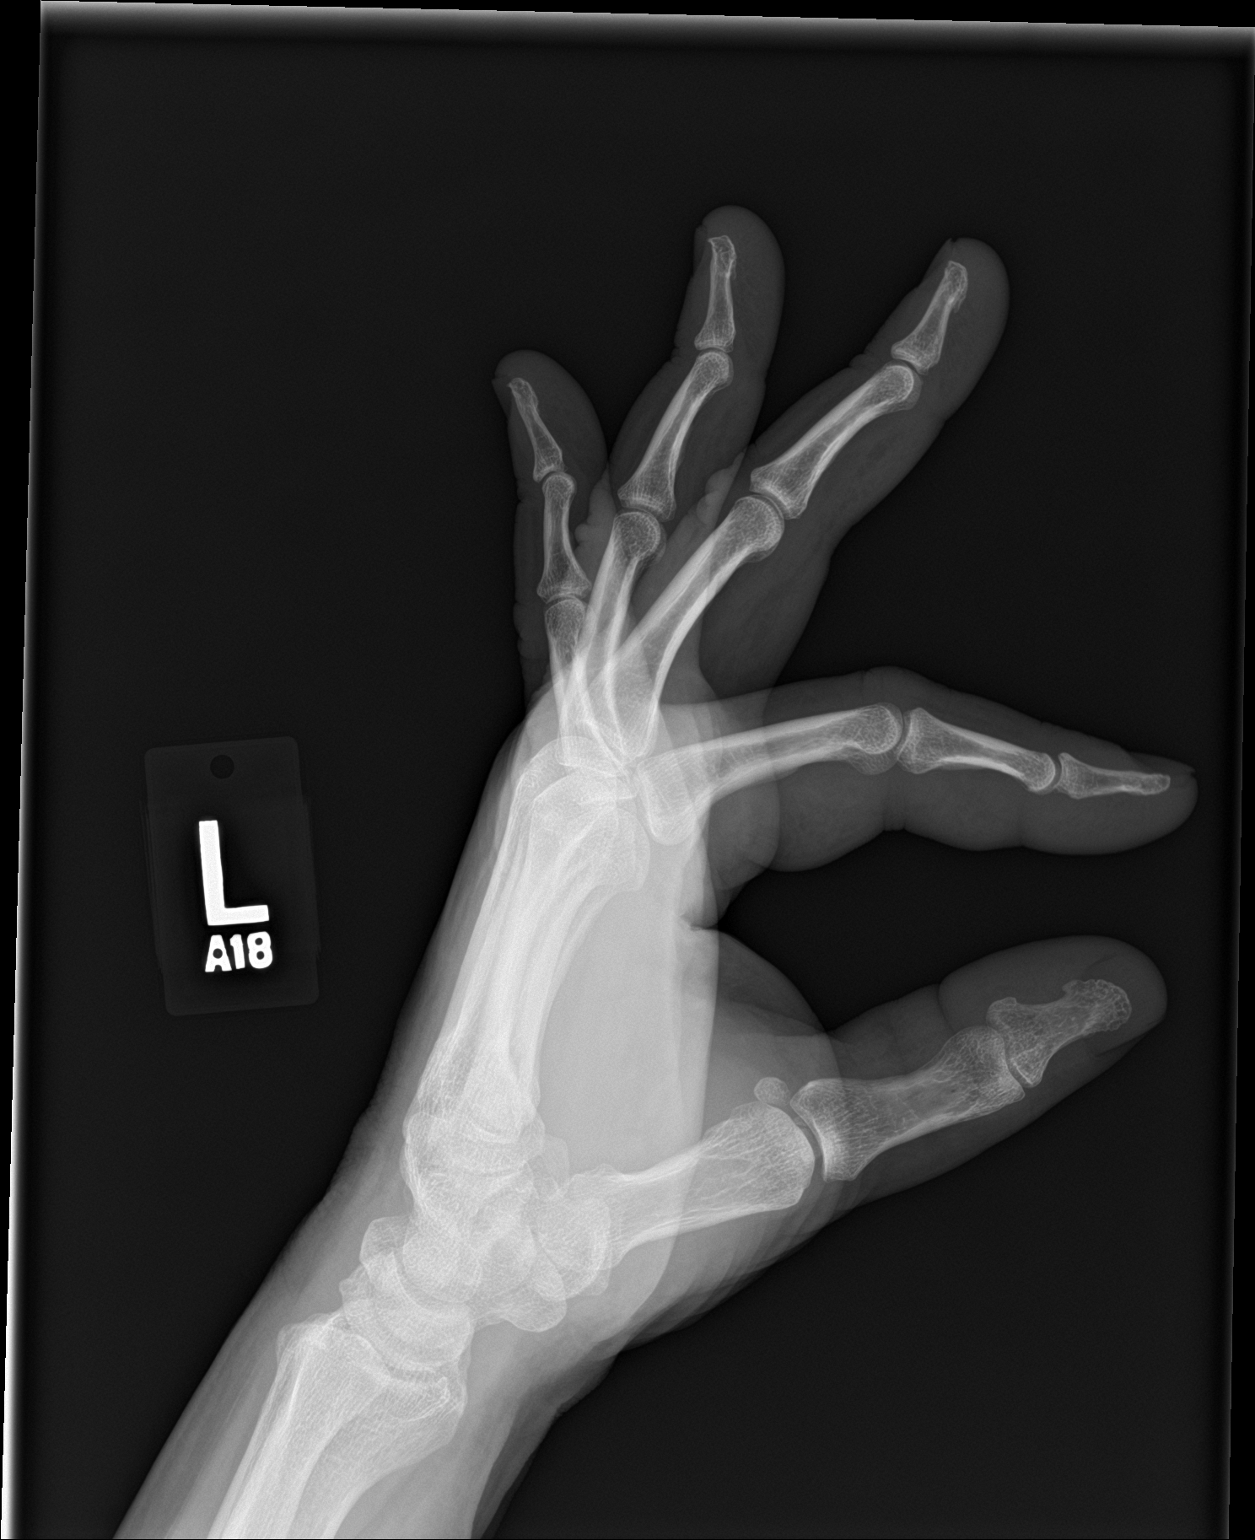

[3 of 3 positions shown; findings below may reference images not displayed]

FINDINGS: There is no evidence of fracture or dislocation. There is no
evidence of arthropathy or other focal bone abnormality. Soft
tissues are unremarkable.
IMPRESSION: Normal left hand.

## 2021-11-24 ENCOUNTER — Ambulatory Visit: Payer: Self-pay | Admitting: Podiatry

## 2022-08-19 ENCOUNTER — Encounter: Payer: Self-pay | Admitting: Gastroenterology
